# Patient Record
Sex: Male | Born: 1975 | Marital: Married | Smoking: Former smoker
Health system: Southern US, Community
[De-identification: ages and names within clinical notes are randomized; demographics above are authoritative.]

## PROBLEM LIST (undated history)

## (undated) DIAGNOSIS — K219 Gastro-esophageal reflux disease without esophagitis: Secondary | ICD-10-CM

## (undated) DIAGNOSIS — E119 Type 2 diabetes mellitus without complications: Secondary | ICD-10-CM

## (undated) HISTORY — PX: NO PAST SURGERIES: SHX2092

---

## 2014-08-16 ENCOUNTER — Ambulatory Visit
Admission: RE | Admit: 2014-08-16 | Discharge: 2014-08-16 | Disposition: A | Discharge status: Home / Self Care | Payer: Self-pay | Source: Ambulatory Visit | Attending: Occupational Medicine | Admitting: Occupational Medicine

## 2014-08-16 ENCOUNTER — Other Ambulatory Visit: Payer: Self-pay | Admitting: Occupational Medicine

## 2014-08-16 DIAGNOSIS — Z111 Encounter for screening for respiratory tuberculosis: Secondary | ICD-10-CM

## 2015-07-18 ENCOUNTER — Ambulatory Visit: Payer: Self-pay

## 2015-07-29 ENCOUNTER — Encounter: Payer: Self-pay | Admitting: Urology

## 2015-07-29 ENCOUNTER — Ambulatory Visit (INDEPENDENT_AMBULATORY_CARE_PROVIDER_SITE_OTHER): Payer: 59 | Admitting: Urology

## 2015-07-29 VITALS — BP 102/69 | HR 83 | Ht 71.0 in | Wt 220.5 lb

## 2015-07-29 DIAGNOSIS — I10 Essential (primary) hypertension: Secondary | ICD-10-CM | POA: Insufficient documentation

## 2015-07-29 DIAGNOSIS — N471 Phimosis: Secondary | ICD-10-CM | POA: Diagnosis not present

## 2015-07-29 DIAGNOSIS — G4700 Insomnia, unspecified: Secondary | ICD-10-CM | POA: Insufficient documentation

## 2015-07-29 DIAGNOSIS — E119 Type 2 diabetes mellitus without complications: Secondary | ICD-10-CM | POA: Insufficient documentation

## 2015-07-29 NOTE — Addendum Note (Signed)
Addended by: Lonna CobbUSSELL, Jowana Thumma L on: 07/29/2015 03:29 PM   Modules accepted: Orders

## 2015-07-29 NOTE — Progress Notes (Signed)
07/29/2015 12:02 PM   Vincent Carlson [birth year] [id number]  Referring provider: Jerl Mina, MD [address] [facility] [place], [place] [address]  Chief Complaint  Patient presents with  . New Patient (Initial Visit)    Phimosis    HPI: The patient is a 40 year old gentleman with a past medical history of diabetes presents today for evaluation of phimosis. The patient has no cyst for approximately one year. It has not worsened to the point that he is no longer able to retract his foreskin. He is unable to have intercourse is resolved this. He is also unable to clean his glans. He's had some issues with paraphimosis for a struggle to reduce his foreskin. He is interested in undergoing a circumcision.   PMH: No past medical history on file.  Surgical History: No past surgical history on file.  Home Medications:    Medication List       This list is accurate as of: 07/29/15 12:02 PM.  Always use your most recent med list.               FARXIGA 10 MG Tabs tablet  Generic drug:  dapagliflozin propanediol  Take by mouth.     glimepiride 4 MG tablet  Commonly known as:  AMARYL  Take 4 mg by mouth daily with breakfast.     lisinopril 10 MG tablet  Commonly known as:  PRINIVIL,ZESTRIL     metFORMIN 1000 MG tablet  Commonly known as:  GLUCOPHAGE  Take 1,000 mg by mouth 2 (two) times daily with a meal.     VICTOZA 18 MG/3ML Sopn  Generic drug:  Liraglutide        Allergies: No Known Allergies  Family History: Family History  Problem Relation Age of Onset  . Prostate cancer Neg Hx   . Kidney cancer Neg Hx     Social History:  reports that he quit smoking about 2 years ago. He does not have any smokeless tobacco history on file. He reports that he drinks about 1.2 oz of alcohol per week. He reports that he does not use illicit drugs.  ROS: UROLOGY Frequent Urination?: No Hard to postpone urination?: No Burning/pain with urination?: Yes Get up at  night to urinate?: Yes Leakage of urine?: No Urine stream starts and stops?: No Trouble starting stream?: No Do you have to strain to urinate?: No Blood in urine?: No Urinary tract infection?: No Sexually transmitted disease?: No Injury to kidneys or bladder?: No Painful intercourse?: Yes Weak stream?: Yes Erection problems?: No Penile pain?: Yes  Gastrointestinal Nausea?: No Vomiting?: No Indigestion/heartburn?: No Diarrhea?: No Constipation?: No  Constitutional Fever: No Night sweats?: No Weight loss?: No Fatigue?: No  Skin Skin rash/lesions?: No Itching?: No  Eyes Blurred vision?: No Double vision?: No  Ears/Nose/Throat Sore throat?: No Sinus problems?: No  Hematologic/Lymphatic Swollen glands?: No Easy bruising?: No  Cardiovascular Leg swelling?: No Chest pain?: No  Respiratory Cough?: No Shortness of breath?: No  Endocrine Excessive thirst?: No  Musculoskeletal Back pain?: No Joint pain?: No  Neurological Headaches?: No Dizziness?: No  Psychologic Depression?: No Anxiety?: No  Physical Exam: BP 102/69 mmHg  Pulse 83  Ht  (1.803 m)  Wt 220 lb 8 oz (100.018 kg)  BMI 30.77 kg/m2  Constitutional:  Alert and oriented, No acute distress. HEENT: [place] AT, moist mucus membranes.  Trachea midline, no masses. Cardiovascular: No clubbing, cyanosis, or edema. Respiratory: Normal respiratory effort, no increased work of breathing. GI:  Abdomen is soft, nontender, nondistended, no abdominal masses GU: No CVA tenderness. Patient with phimosis. Unable to retract foreskin exam. Normal testicles descended equally bilaterally. No masses. Skin: No rashes, bruises or suspicious lesions. Lymph: No cervical or inguinal adenopathy. Neurologic: Grossly intact, no focal deficits, moving all 4 extremities. Psychiatric: Normal mood and affect.  Laboratory Data: No results found for: WBC, HGB, HCT, MCV, PLT  No results found for: CREATININE  No  results found for: PSA  No results found for: TESTOSTERONE  No results found for: HGBA1C  Urinalysis No results found for: COLORURINE, APPEARANCEUR, LABSPEC, PHURINE, GLUCOSEU, HGBUR, BILIRUBINUR, KETONESUR, PROTEINUR, UROBILINOGEN, NITRITE, LEUKOCYTESUR  Pertinent   Assessment & Plan:  1. Phimosis Discussed the patient the risks, benefits, and indications for a circumcision. He understands the risks include but are not limited to bleeding, infection, poor cosmesis, and damage to surrounding structures. All questions were answered. The patient elected to proceed with a circumcision.Hildred Laser.   Jodey Burbano James Sura Canul, MD  [facility] [address], [address] [place], [place] [address] [phone]

## 2015-08-01 LAB — CULTURE, URINE COMPREHENSIVE

## 2015-08-02 ENCOUNTER — Telehealth: Payer: Self-pay | Admitting: Radiology

## 2015-08-02 NOTE — Telephone Encounter (Signed)
LMOM. Need to notify pt of surgery information. 

## 2015-08-02 NOTE — Telephone Encounter (Signed)
Notified pt of surgery scheduled with Dr Sherryl Barters on 08/31/15, pre-admit phone interview on 08/19/15 between 9am-1pm & to call day prior to surgery for arrival time to SDS. Pt voices understanding.

## 2015-08-19 ENCOUNTER — Encounter
Admission: RE | Admit: 2015-08-19 | Discharge: 2015-08-19 | Disposition: A | Discharge status: Home / Self Care | Payer: 59 | Source: Ambulatory Visit | Attending: Urology | Admitting: Urology

## 2015-08-19 HISTORY — DX: Type 2 diabetes mellitus without complications: E11.9

## 2015-08-19 HISTORY — DX: Gastro-esophageal reflux disease without esophagitis: K21.9

## 2015-08-19 NOTE — Patient Instructions (Signed)
  Your procedure is scheduled on: 08-31-15 [facility](WEDNESDAY) Report to Same Day Surgery 2nd floor medical mall To find out your arrival time please call [phone] between 1PM - 3PM on 08-30-15 (TUESDAY)  Remember: Instructions that are not followed completely may result in serious medical risk, up to and including death, or upon the discretion of your surgeon and anesthesiologist your surgery may need to be rescheduled.    _x___ 1. Do not eat food or drink liquids after midnight. No gum chewing or hard candies.     __x__ 2. No Alcohol for 24 hours before or after surgery.   __x__3. No Smoking for 24 prior to surgery.   ____  4. Bring all medications with you on the day of surgery if instructed.    __x__ 5. Notify your doctor if there is any change in your medical condition     (cold, fever, infections).     Do not wear jewelry, make-up, hairpins, clips or nail polish.  Do not wear lotions, powders, or perfumes. You may wear deodorant.  Do not shave 48 hours prior to surgery. Men may shave face and neck.  Do not bring valuables to the hospital.    [facility] is not responsible for any belongings or valuables.               Contacts, dentures or bridgework may not be worn into surgery.  Leave your suitcase in the car. After surgery it may be brought to your room.  For patients admitted to the hospital, discharge time is determined by your treatment team.   Patients discharged the day of surgery will not be allowed to drive home.    Please read over the following fact sheets that you were given:   [facility] Preparing for Surgery and or MRSA Information   _x___ Take these medicines the morning of surgery with A SIP OF WATER:    1. LISINOPRIL  2.  3.  4.  5.  6.  ____ Fleet Enema (as directed)   _x___ Use CHG Soap or sage wipes as directed on instruction sheet   ____ Use inhalers on the day of surgery and bring to hospital day of surgery  _X___ Stop metformin 2 days prior to  surgery-LAST DOSE ON Sunday 08-28-15     ____ Take 1/2 of usual insulin dose the night before surgery and none on the morning of surgery.   ____ Stop aspirin or coumadin, or plavix  _x__ Stop Anti-inflammatories such as Advil, Aleve, Ibuprofen, Motrin, Naproxen,          Naprosyn, Goodies powders or aspirin products. Ok to take Tylenol.   ____ Stop supplements until after surgery.    ____ Bring C-Pap to the hospital.

## 2015-08-22 MED ORDER — FAMOTIDINE 20 MG PO TABS
ORAL_TABLET | ORAL | Status: AC
  Filled 2015-08-22: qty 1

## 2015-08-22 MED ORDER — CIPROFLOXACIN IN D5W 400 MG/200ML IV SOLN
INTRAVENOUS | Status: AC
  Filled 2015-08-22: qty 200

## 2015-08-29 ENCOUNTER — Telehealth: Payer: Self-pay | Admitting: Radiology

## 2015-08-29 NOTE — Telephone Encounter (Signed)
Notified pt that surgery has been r/s to 10/26/15 as requested. Ad[facility] he will not need to repeat pre-admission appt prior to surgery. Advised pt to follow instructions given by pre-admit testing including being npo after mn & to call day prior to surgery for arrival time to SDS. Pt voices understanding.

## 2015-08-29 NOTE — Telephone Encounter (Signed)
Pt called to r/s surgery with Dr Sherryl BartersBudzyn from 08/31/15 to 10/26/15. Will arrange this.

## 2015-08-30 NOTE — Pre-Procedure Instructions (Deleted)
SPOKE WITH ASHLEY AT DR MASOUDS ABOUT PTS ABNORMAL EKG AND TO MAKE SURE SHE HAD GOTTEN MY MESSAGE YESTERDAY ALONG WITH THE EKG AND CLEARANCE REQUEST THAT I HAD FAXED OVER.  ASHLEY STATED THAT SHE DID GET MY MESSAGE AND CLEARANCE PAPERWORK THAT I HAD FAXED OVER AND THAT THEY ARE GOING TO HAVE TO GET PT TO COME IN TODAY FOR DR MASOUD TO SEE HER.  I GAVE ASHLEY MY DIRECT # AGAIN AND TOLD HER TO CALL AND LET ME KNOW IF PT WILL BE CLEARED FOR HER SURGERY TOMORROW.  SHE VERBALIZED THAT SHE WOULD

## 2015-10-23 NOTE — Patient Instructions (Signed)
  Your procedure is scheduled on: 10-26-15 [facility](WEDNESDAY) Report to Same Day Surgery 2nd floor medical mall To find out your arrival time please call [phone] between 1PM - 3PM on 10-25-15 (TUESDAY)  Remember: Instructions that are not followed completely may result in serious medical risk, up to and including death, or upon the discretion of your surgeon and anesthesiologist your surgery may need to be rescheduled.    _x___ 1. Do not eat food or drink liquids after midnight. No gum chewing or hard candies.     __x__ 2. No Alcohol for 24 hours before or after surgery.   __x__3. No Smoking for 24 prior to surgery.   ____  4. Bring all medications with you on the day of surgery if instructed.    __x__ 5. Notify your doctor if there is any change in your medical condition     (cold, fever, infections).     Do not wear jewelry, make-up, hairpins, clips or nail polish.  Do not wear lotions, powders, or perfumes. You may wear deodorant.  Do not shave 48 hours prior to surgery. Men may shave face and neck.  Do not bring valuables to the hospital.    [facility] is not responsible for any belongings or valuables.               Contacts, dentures or bridgework may not be worn into surgery.  Leave your suitcase in the car. After surgery it may be brought to your room.  For patients admitted to the hospital, discharge time is determined by your treatment team.   Patients discharged the day of surgery will not be allowed to drive home.    Please read over the following fact sheets that you were given:   [facility] Preparing for Surgery and or MRSA Information   _x___ Take these medicines the morning of surgery with A SIP OF WATER:    1. LISINOPRIL  2.  3.  4.  5.  6.  ____Fleets enema or Magnesium Citrate as directed.   _x___ Use CHG Soap or sage wipes as directed on instruction sheet   ____ Use inhalers on the day of surgery and bring to hospital day of surgery  _X___ Stop  metformin 2 days prior to surgery-LAST DOSE Sunday, October 15th    ____ Take 1/2 of usual insulin dose the night before surgery and none on the morning of   surgery.   ____ Stop aspirin or coumadin, or plavix  x__ Stop Anti-inflammatories such as Advil, Aleve, Ibuprofen, Motrin, Naproxen,          Naprosyn, Goodies powders or aspirin products. Ok to take Tylenol.   ____ Stop supplements until after surgery.    ____ Bring C-Pap to the hospital.

## 2015-10-24 ENCOUNTER — Encounter
Admission: RE | Admit: 2015-10-24 | Discharge: 2015-10-24 | Disposition: A | Discharge status: Home / Self Care | Payer: 59 | Source: Ambulatory Visit | Attending: Urology | Admitting: Urology

## 2015-10-24 DIAGNOSIS — K219 Gastro-esophageal reflux disease without esophagitis: Secondary | ICD-10-CM | POA: Diagnosis not present

## 2015-10-24 DIAGNOSIS — Z01812 Encounter for preprocedural laboratory examination: Secondary | ICD-10-CM | POA: Insufficient documentation

## 2015-10-24 DIAGNOSIS — I498 Other specified cardiac arrhythmias: Secondary | ICD-10-CM

## 2015-10-24 DIAGNOSIS — E119 Type 2 diabetes mellitus without complications: Secondary | ICD-10-CM | POA: Diagnosis not present

## 2015-10-24 DIAGNOSIS — Z87891 Personal history of nicotine dependence: Secondary | ICD-10-CM | POA: Diagnosis not present

## 2015-10-24 DIAGNOSIS — N471 Phimosis: Secondary | ICD-10-CM | POA: Insufficient documentation

## 2015-10-24 DIAGNOSIS — Z79899 Other long term (current) drug therapy: Secondary | ICD-10-CM | POA: Diagnosis not present

## 2015-10-24 DIAGNOSIS — I10 Essential (primary) hypertension: Secondary | ICD-10-CM | POA: Diagnosis not present

## 2015-10-24 DIAGNOSIS — Z7984 Long term (current) use of oral hypoglycemic drugs: Secondary | ICD-10-CM | POA: Diagnosis not present

## 2015-10-24 LAB — BASIC METABOLIC PANEL
Anion gap: 10 (ref 5–15)
BUN: 13 mg/dL (ref 6–20)
CHLORIDE: 106 mmol/L (ref 101–111)
CO2: 23 mmol/L (ref 22–32)
CREATININE: 0.8 mg/dL (ref 0.61–1.24)
Calcium: 9.2 mg/dL (ref 8.9–10.3)
Glucose, Bld: 115 mg/dL — ABNORMAL HIGH (ref 65–99)
Potassium: 3.9 mmol/L (ref 3.5–5.1)
SODIUM: 139 mmol/L (ref 135–145)

## 2015-10-26 ENCOUNTER — Encounter
Admission: RE | Disposition: A | Discharge status: Home / Self Care | Payer: Self-pay | Source: Ambulatory Visit | Attending: Urology

## 2015-10-26 ENCOUNTER — Telehealth: Payer: Self-pay

## 2015-10-26 ENCOUNTER — Ambulatory Visit: Payer: 59 | Admitting: Registered Nurse

## 2015-10-26 ENCOUNTER — Telehealth: Payer: Self-pay | Admitting: Urology

## 2015-10-26 ENCOUNTER — Encounter: Payer: Self-pay | Admitting: *Deleted

## 2015-10-26 ENCOUNTER — Ambulatory Visit
Admission: RE | Admit: 2015-10-26 | Discharge: 2015-10-26 | Disposition: A | Discharge status: Home / Self Care | Payer: 59 | Source: Ambulatory Visit | Attending: Urology | Admitting: Urology

## 2015-10-26 DIAGNOSIS — Z79899 Other long term (current) drug therapy: Secondary | ICD-10-CM | POA: Insufficient documentation

## 2015-10-26 DIAGNOSIS — E119 Type 2 diabetes mellitus without complications: Secondary | ICD-10-CM | POA: Diagnosis not present

## 2015-10-26 DIAGNOSIS — N471 Phimosis: Secondary | ICD-10-CM | POA: Diagnosis not present

## 2015-10-26 DIAGNOSIS — K219 Gastro-esophageal reflux disease without esophagitis: Secondary | ICD-10-CM | POA: Insufficient documentation

## 2015-10-26 DIAGNOSIS — Z87891 Personal history of nicotine dependence: Secondary | ICD-10-CM | POA: Diagnosis not present

## 2015-10-26 DIAGNOSIS — I10 Essential (primary) hypertension: Secondary | ICD-10-CM | POA: Insufficient documentation

## 2015-10-26 DIAGNOSIS — Z7984 Long term (current) use of oral hypoglycemic drugs: Secondary | ICD-10-CM | POA: Insufficient documentation

## 2015-10-26 HISTORY — PX: CIRCUMCISION: SHX1350

## 2015-10-26 LAB — GLUCOSE, CAPILLARY: Glucose-Capillary: 129 mg/dL — ABNORMAL HIGH (ref 65–99)

## 2015-10-26 SURGERY — CIRCUMCISION, ADULT
Anesthesia: General

## 2015-10-26 MED ORDER — FAMOTIDINE 20 MG PO TABS
20.0000 mg | ORAL_TABLET | Freq: Once | ORAL | Status: AC
  Administered 2015-10-26: 20 mg via ORAL

## 2015-10-26 MED ORDER — ONDANSETRON HCL 4 MG/2ML IJ SOLN: 4.0000 mg | Freq: Once | INTRAMUSCULAR | Status: DC | PRN

## 2015-10-26 MED ORDER — FENTANYL CITRATE (PF) 100 MCG/2ML IJ SOLN
INTRAMUSCULAR | Status: DC | PRN
  Administered 2015-10-26: 25 ug via INTRAVENOUS
  Administered 2015-10-26: 50 ug via INTRAVENOUS
  Administered 2015-10-26 (×2): 25 ug via INTRAVENOUS

## 2015-10-26 MED ORDER — SODIUM CHLORIDE 0.9 % IV SOLN
INTRAVENOUS | Status: DC
  Administered 2015-10-26: 07:00:00 via INTRAVENOUS

## 2015-10-26 MED ORDER — FAMOTIDINE 20 MG PO TABS
ORAL_TABLET | ORAL | Status: AC
  Administered 2015-10-26: 20 mg via ORAL
  Filled 2015-10-26: qty 1

## 2015-10-26 MED ORDER — CEPHALEXIN 500 MG PO CAPS: 500.0000 mg | ORAL_CAPSULE | Freq: Three times a day (TID) | ORAL | 0 refills | Status: DC

## 2015-10-26 MED ORDER — BACITRACIN ZINC 500 UNIT/GM EX OINT
TOPICAL_OINTMENT | CUTANEOUS | Status: AC
  Filled 2015-10-26: qty 28.35

## 2015-10-26 MED ORDER — LIDOCAINE HCL (CARDIAC) 20 MG/ML IV SOLN
INTRAVENOUS | Status: DC | PRN
  Administered 2015-10-26: 80 mg via INTRAVENOUS

## 2015-10-26 MED ORDER — BUPIVACAINE HCL (PF) 0.5 % IJ SOLN
INTRAMUSCULAR | Status: DC | PRN
  Administered 2015-10-26: 10 mL

## 2015-10-26 MED ORDER — BUPIVACAINE HCL (PF) 0.5 % IJ SOLN
INTRAMUSCULAR | Status: AC
Start: 2015-10-26 — End: 2015-10-26
  Filled 2015-10-26: qty 30

## 2015-10-26 MED ORDER — FENTANYL CITRATE (PF) 100 MCG/2ML IJ SOLN
INTRAMUSCULAR | Status: AC
  Administered 2015-10-26: 25 ug via INTRAVENOUS
  Filled 2015-10-26: qty 2

## 2015-10-26 MED ORDER — HYDROCODONE-ACETAMINOPHEN 5-325 MG PO TABS: 1.0000 | ORAL_TABLET | ORAL | 0 refills | Status: DC | PRN

## 2015-10-26 MED ORDER — CEFAZOLIN SODIUM-DEXTROSE 2-4 GM/100ML-% IV SOLN
INTRAVENOUS | Status: AC
  Administered 2015-10-26: 2 g via INTRAVENOUS
  Filled 2015-10-26: qty 100

## 2015-10-26 MED ORDER — ONDANSETRON HCL 4 MG/2ML IJ SOLN
INTRAMUSCULAR | Status: DC | PRN
  Administered 2015-10-26: 4 mg via INTRAVENOUS

## 2015-10-26 MED ORDER — FENTANYL CITRATE (PF) 100 MCG/2ML IJ SOLN
25.0000 ug | INTRAMUSCULAR | Status: DC | PRN
  Administered 2015-10-26 (×4): 25 ug via INTRAVENOUS

## 2015-10-26 MED ORDER — ACETAMINOPHEN 10 MG/ML IV SOLN
INTRAVENOUS | Status: AC
  Filled 2015-10-26: qty 100

## 2015-10-26 MED ORDER — PROPOFOL 10 MG/ML IV BOLUS
INTRAVENOUS | Status: DC | PRN
  Administered 2015-10-26: 200 mg via INTRAVENOUS

## 2015-10-26 MED ORDER — CEFAZOLIN SODIUM-DEXTROSE 2-4 GM/100ML-% IV SOLN
2.0000 g | Freq: Once | INTRAVENOUS | Status: AC
  Administered 2015-10-26: 2 g via INTRAVENOUS

## 2015-10-26 MED ORDER — MIDAZOLAM HCL 2 MG/2ML IJ SOLN
INTRAMUSCULAR | Status: DC | PRN
  Administered 2015-10-26: 2 mg via INTRAVENOUS

## 2015-10-26 MED ORDER — ACETAMINOPHEN 10 MG/ML IV SOLN
INTRAVENOUS | Status: DC | PRN
  Administered 2015-10-26: 1000 mg via INTRAVENOUS

## 2015-10-26 SURGICAL SUPPLY — 24 items
BANDAGE CONFORM 2X5YD N/S (GAUZE/BANDAGES/DRESSINGS) ×3 IMPLANT
BLADE SURG 15 STRL LF DISP TIS (BLADE) ×1 IMPLANT
BLADE SURG 15 STRL SS (BLADE) ×2
CANISTER SUCT 1200ML W/VALVE (MISCELLANEOUS) ×3 IMPLANT
CHLORAPREP W/TINT 26ML (MISCELLANEOUS) ×3 IMPLANT
DRAPE LAPAROTOMY 77X122 PED (DRAPES) ×3 IMPLANT
ELECT REM PT RETURN 9FT ADLT (ELECTROSURGICAL) ×3
ELECTRODE REM PT RTRN 9FT ADLT (ELECTROSURGICAL) ×1 IMPLANT
GAUZE PETROLATUM 1 X8 (GAUZE/BANDAGES/DRESSINGS) ×3 IMPLANT
GAUZE STRETCH 2X75IN STRL (MISCELLANEOUS) ×3 IMPLANT
GLOVE BIO SURGEON STRL SZ7 (GLOVE) ×6 IMPLANT
GLOVE BIOGEL M STRL SZ7.5 (GLOVE) ×3 IMPLANT
GLOVE INDICATOR 7.5 STRL GRN (GLOVE) ×6 IMPLANT
GOWN STRL REUS W/ TWL LRG LVL3 (GOWN DISPOSABLE) ×1 IMPLANT
GOWN STRL REUS W/ TWL XL LVL3 (GOWN DISPOSABLE) ×1 IMPLANT
GOWN STRL REUS W/TWL LRG LVL3 (GOWN DISPOSABLE) ×2
GOWN STRL REUS W/TWL XL LVL3 (GOWN DISPOSABLE) ×2
KIT RM TURNOVER STRD PROC AR (KITS) ×3 IMPLANT
LABEL OR SOLS (LABEL) ×3 IMPLANT
NEEDLE HYPO 25X1 1.5 SAFETY (NEEDLE) ×3 IMPLANT
NS IRRIG 500ML POUR BTL (IV SOLUTION) ×3 IMPLANT
PACK BASIN MINOR ARMC (MISCELLANEOUS) ×3 IMPLANT
SUT CHROMIC 3 0 SH 27 (SUTURE) ×12 IMPLANT
SYRINGE 10CC LL (SYRINGE) ×3 IMPLANT

## 2015-10-26 NOTE — Telephone Encounter (Signed)
-----   Message from Hildred LaserBrian James Budzyn, MD sent at 10/26/2015  8:58 AM EDT ----- Patient needs post op check in one month. thanks

## 2015-10-26 NOTE — Telephone Encounter (Signed)
done

## 2015-10-26 NOTE — H&P (Signed)
Expand All Collapse All       Carmelia RollerKaushal Nardo 05/22/1975 [id number]  Referring provider: Jerl MinaJames Hedrick, MD [address] [facility] [place], [place] [address]      Chief Complaint  Patient presents with  . New Patient (Initial Visit)    Phimosis    HPI: The patient is a 40 year old gentleman with a past medical history of diabetes presents today for evaluation of phimosis. The patient has no cyst for approximately one year. It has not worsened to the point that he is no longer able to retract his foreskin. He is unable to have intercourse is resolved this. He is also unable to clean his glans. He's had some issues with paraphimosis for a struggle to reduce his foreskin. He is interested in undergoing a circumcision.   PMH: No past medical history on file.  Surgical History: No past surgical history on file.  Home Medications:        Medication List       This list is accurate as of: 07/29/15 12:02 PM.  Always use your most recent med list.                FARXIGA 10 MG Tabs tablet  Generic drug:  dapagliflozin propanediol  Take by mouth.     glimepiride 4 MG tablet  Commonly known as:  AMARYL  Take 4 mg by mouth daily with breakfast.     lisinopril 10 MG tablet  Commonly known as:  PRINIVIL,ZESTRIL     metFORMIN 1000 MG tablet  Commonly known as:  GLUCOPHAGE  Take 1,000 mg by mouth 2 (two) times daily with a meal.     VICTOZA 18 MG/3ML Sopn  Generic drug:  Liraglutide        Allergies: No Known Allergies  Family History:      Family History  Problem Relation Age of Onset  . Prostate cancer Neg Hx   . Kidney cancer Neg Hx     Social History:  reports that he quit smoking about 2 years ago. He does not have any smokeless tobacco history on file. He reports that he drinks about 1.2 oz of alcohol per week. He reports that he does not use illicit drugs.  ROS: UROLOGY Frequent Urination?: No Hard to  postpone urination?: No Burning/pain with urination?: Yes Get up at night to urinate?: Yes Leakage of urine?: No Urine stream starts and stops?: No Trouble starting stream?: No Do you have to strain to urinate?: No Blood in urine?: No Urinary tract infection?: No Sexually transmitted disease?: No Injury to kidneys or bladder?: No Painful intercourse?: Yes Weak stream?: Yes Erection problems?: No Penile pain?: Yes  Gastrointestinal Nausea?: No Vomiting?: No Indigestion/heartburn?: No Diarrhea?: No Constipation?: No  Constitutional Fever: No Night sweats?: No Weight loss?: No Fatigue?: No  Skin Skin rash/lesions?: No Itching?: No  Eyes Blurred vision?: No Double vision?: No  Ears/Nose/Throat Sore throat?: No Sinus problems?: No  Hematologic/Lymphatic Swollen glands?: No Easy bruising?: No  Cardiovascular Leg swelling?: No Chest pain?: No  Respiratory Cough?: No Shortness of breath?: No  Endocrine Excessive thirst?: No  Musculoskeletal Back pain?: No Joint pain?: No  Neurological Headaches?: No Dizziness?: No  Psychologic Depression?: No Anxiety?: No  Physical Exam: BP 102/69 mmHg  Pulse 83  Ht 5\' 11"  (1.803 m)  Wt 220 lb 8 oz (100.018 kg)  BMI 30.77 kg/m2  Constitutional:  Alert and oriented, No acute distress. HEENT: [place] AT, moist mucus membranes.  Trachea midline,  no masses. Cardiovascular: No clubbing, cyanosis, or edema. RRR Respiratory: Normal respiratory effort, no increased work of breathing. Lungs clear GI: Abdomen is soft, nontender, nondistended, no abdominal masses GU: No CVA tenderness. Patient with phimosis. Unable to retract foreskin exam. Normal testicles descended equally bilaterally. No masses. Skin: No rashes, bruises or suspicious lesions. Lymph: No cervical or inguinal adenopathy. Neurologic: Grossly intact, no focal deficits, moving all 4 extremities. Psychiatric: Normal mood and affect.  Laboratory  Data: RecentLabs  No results found for: WBC, HGB, HCT, MCV, PLT    RecentLabs  No results found for: CREATININE    RecentLabs  No results found for: PSA    RecentLabs  No results found for: TESTOSTERONE    RecentLabs  No results found for: HGBA1C    Urinalysis Labs(Brief)  No results found for: COLORURINE, APPEARANCEUR, LABSPEC, PHURINE, GLUCOSEU, HGBUR, BILIRUBINUR, KETONESUR, PROTEINUR, UROBILINOGEN, NITRITE, LEUKOCYTESUR    Pertinent   Assessment & Plan:  1. Phimosis Discussed the patient the risks, benefits, and indications for a circumcision. He understands the risks include but are not limited to bleeding, infection, poor cosmesis, and damage to surrounding structures. All questions were answered. The patient elected to proceed with a [place]  [id number] [facility] [address] [place], [place] [address] [phone]

## 2015-10-26 NOTE — Anesthesia Procedure Notes (Signed)
Procedure Name: LMA Insertion Date/Time: 10/26/2015 7:55 AM Performed by: Karoline CaldwellSTARR, Marizol Borror Pre-anesthesia Checklist: Patient identified, Emergency Drugs available, Suction available, Patient being monitored and Timeout performed Patient Re-evaluated:Patient Re-evaluated prior to inductionOxygen Delivery Method: Circle system utilized Preoxygenation: Pre-oxygenation with 100% oxygen Intubation Type: IV induction Ventilation: Mask ventilation without difficulty LMA: LMA inserted LMA Size: 4.5 Number of attempts: 1 Tube secured with: Tape Dental Injury: Teeth and Oropharynx as per pre-operative assessment

## 2015-10-26 NOTE — Op Note (Signed)
Date of procedure: 10/26/15  Preoperative diagnosis:  1. Phimosis   Postoperative diagnosis:  1. Phimosis   Procedure: 1. Circumcision  Surgeon: Baruch Gouty, MD  Anesthesia: General  Complications: None  Intraoperative findings: The patient had a phimosis that was difficult to retract. He underwent an unremarkable circumcision.  EBL: None  Specimens: None  Drains: None  Disposition: Stable to the postanesthesia care unit  Indication for procedure: The patient is a 40 y.o. male with a phimosis was unable to retract his foreskin for over a year presents today for elective circumcision.  After reviewing the management options for treatment, the patient elected to proceed with the above surgical procedure(s). We have discussed the potential benefits and risks of the procedure, side effects of the proposed treatment, the likelihood of the patient achieving the goals of the procedure, and any potential problems that might occur during the procedure or recuperation. Informed consent has been obtained.  Description of procedure: The patient was met in the preoperative area. All risks, benefits, and indications of the procedure were described in great detail. The patient consented to the procedure. Preoperative antibiotics were given. The patient was taken to the operative theater. General anesthesia was induced per the anesthesia service. The patient was then placed in the supine position and prepped and draped in the usual sterile fashion. A preoperative timeout was called.   Markings were made in a circumferential fashion and the proximal and distal ends of the foreskin. This was measured to ensure that he appropriate amount of penile skin remaining. These the distal and proximal markings were then incised with a #15 blade scalpel. The foreskin was then removed with electrocautery with great care not to injure surrounding structures. After the foreskin was removed, hemostasis was obtained  which was excellent. Penile skin was reapproximated with 3-0 chromic at 12:00 and 6:00. 3-0 interrupted chromic sutures then placed at 6:00 and 9:00. The remaining areas of skin reapproximated circumferentially with 3-0 chromic interrupted sutures. The right limb was then placed incision. It was wrapped in gauze. The patient was awoke from anesthesia and transferred in stable condition to the post anesthesia care unit.  Plan: The patient will follow-up in one month for a wound check.  Baruch Gouty, M.D.

## 2015-10-26 NOTE — Addendum Note (Signed)
Addendum  created 10/26/15 1332 by Karoline Caldwelleana Marvell Tamer, CRNA   Charge Capture section accepted

## 2015-10-26 NOTE — Anesthesia Preprocedure Evaluation (Signed)
Anesthesia Evaluation  Patient identified by MRN, date of birth, ID band Patient awake    Reviewed: Allergy & Precautions, NPO status , Patient's Chart, lab work & pertinent test results  Airway Mallampati: III  TM Distance: >3 FB     Dental no notable dental hx. (+) Chipped   Pulmonary former smoker,    Pulmonary exam normal        Cardiovascular hypertension, Pt. on medications Normal cardiovascular exam     Neuro/Psych negative neurological ROS  negative psych ROS   GI/Hepatic Neg liver ROS, GERD  Controlled,  Endo/Other  diabetes, Well Controlled, Type 2, Oral Hypoglycemic Agents  Renal/GU negative Renal ROS  negative genitourinary   Musculoskeletal negative musculoskeletal ROS (+)   Abdominal Normal abdominal exam  (+)   Peds negative pediatric ROS (+)  Hematology negative hematology ROS (+)   Anesthesia Other Findings   Reproductive/Obstetrics                             Anesthesia Physical Anesthesia Plan  ASA: II  Anesthesia Plan: General   Post-op Pain Management:    Induction: Intravenous  Airway Management Planned: LMA  Additional Equipment:   Intra-op Plan:   Post-operative Plan: Extubation in OR  Informed Consent: I have reviewed the patients History and Physical, chart, labs and discussed the procedure including the risks, benefits and alternatives for the proposed anesthesia with the patient or authorized representative who has indicated his/her understanding and acceptance.   Dental advisory given  Plan Discussed with: CRNA and Surgeon  Anesthesia Plan Comments:         Anesthesia Quick Evaluation

## 2015-10-26 NOTE — Transfer of Care (Signed)
Immediate Anesthesia Transfer of Care Note  Patient: Vincent Carlson  Procedure(s) Performed: Procedure(s): CIRCUMCISION ADULT (N/A)  Patient Location: PACU  Anesthesia Type:General  Level of Consciousness: awake  Airway & Oxygen Therapy: Patient Spontanous Breathing  Post-op Assessment: Report given to RN  Post vital signs: stable  Last Vitals:  Vitals:   10/26/15 0857 10/26/15 0902  BP:    Pulse:  65  Resp:  10  Temp: (P) 36.7 C 36.1 C    Last Pain:  Vitals:   10/26/15 0902  TempSrc: Temporal         Complications: No apparent anesthesia complications

## 2015-10-26 NOTE — Discharge Instructions (Signed)

## 2015-10-26 NOTE — Anesthesia Postprocedure Evaluation (Signed)
Anesthesia Post Note  Patient: Vincent Carlson  Procedure(s) Performed: Procedure(s) (LRB): CIRCUMCISION ADULT (N/A)  Patient location during evaluation: PACU Anesthesia Type: General Level of consciousness: awake and alert and oriented Pain management: pain level controlled Vital Signs Assessment: post-procedure vital signs reviewed and stable Respiratory status: spontaneous breathing Cardiovascular status: blood pressure returned to baseline Anesthetic complications: no    Last Vitals:  Vitals:   10/26/15 1014 10/26/15 1044  BP: (!) 119/91 133/79  Pulse: 68 62  Resp:  18  Temp: 36.1 C     Last Pain:  Vitals:   10/26/15 1044  TempSrc:   PainSc: 2                  Lemario Chaikin

## 2015-10-31 ENCOUNTER — Telehealth: Payer: Self-pay

## 2015-10-31 NOTE — Telephone Encounter (Signed)
The pt called complaining that he still got some swelling, discomfort to his incision site after circumcision x 5 days ago. I informed the pt that swelling and soreness is normal. I reassured him that swelling, slight redness and scant drainage along the incision site is normal. I told him if he notice increased pain and swelling or generalized redness to notify us. Pt verbal understanding.

## 2015-11-25 ENCOUNTER — Encounter: Payer: Self-pay | Admitting: Urology

## 2015-11-25 ENCOUNTER — Ambulatory Visit: Payer: 59 | Admitting: Urology

## 2015-11-25 VITALS — BP 114/77 | HR 88 | Ht 71.0 in | Wt 224.2 lb

## 2015-11-25 DIAGNOSIS — N471 Phimosis: Secondary | ICD-10-CM

## 2015-11-25 NOTE — Progress Notes (Signed)
11/25/2015 9:39 AM   Carmelia RollerKaushal Addison 02/07/1975 [id number]  Referring provider: Jerl MinaJames Hedrick, MD [address] [facility] [place], [place] [address]  Chief Complaint  Patient presents with  . Routine Post Op    Wound check    HPI: The patient is a 40 year old gentleman who presents for a wound check following a circumcision. Circumcision was in 10/26/2015.  It is healing well. His pain is improved. He does note that= his frenulum this seems still attached to him. He is currently not bothered by this.   PMH: Past Medical History:  Diagnosis Date  . Diabetes mellitus without complication (HCC)   . GERD (gastroesophageal reflux disease)    OCC    Surgical History: Past Surgical History:  Procedure Laterality Date  . CIRCUMCISION N/A 10/26/2015   Procedure: CIRCUMCISION ADULT;  Surgeon: Hildred LaserBrian James Hartman Minahan, MD;  Location: [facility] ORS;  Service: Urology;  Laterality: N/A;  . NO PAST SURGERIES      Home Medications:    Medication List       Accurate as of 11/25/15  9:39 AM. Always use your most recent med list.          acetaminophen 500 MG tablet Commonly known as:  TYLENOL Take 500 mg by mouth every 6 (six) hours as needed for mild pain.   cephALEXin 500 MG capsule Commonly known as:  KEFLEX Take 1 capsule (500 mg total) by mouth 3 (three) times daily.   FARXIGA 10 MG Tabs tablet Generic drug:  dapagliflozin propanediol Take 10 mg by mouth every evening.   glimepiride 2 MG tablet Commonly known as:  AMARYL Take 2 mg by mouth 2 (two) times daily.   HYDROcodone-acetaminophen 5-325 MG tablet Commonly known as:  NORCO Take 1-2 tablets by mouth every 4 (four) hours as needed for moderate pain.   lisinopril 10 MG tablet Commonly known as:  PRINIVIL,ZESTRIL Take 10 mg by mouth every morning.   metFORMIN 1000 MG (MOD) 24 hr tablet Commonly known as:  GLUMETZA Take 500 mg by mouth 2 (two) times daily with a meal.   VICTOZA 18 MG/3ML Sopn Generic drug:   liraglutide Inject 1.8 mg into the skin daily.       Allergies: No Known Allergies  Family History: Family History  Problem Relation Age of Onset  . Prostate cancer Neg Hx   . Kidney cancer Neg Hx     Social History:  reports that he quit smoking about 2 years ago. His smoking use included Cigarettes. He has a 20.00 pack-year smoking history. He has never used smokeless tobacco. He reports that he drinks about 1.2 oz of alcohol per week . He reports that he does not use drugs.  ROS: UROLOGY Frequent Urination?: No Hard to postpone urination?: No Burning/pain with urination?: No Get up at night to urinate?: No Leakage of urine?: No Urine stream starts and stops?: No Trouble starting stream?: No Do you have to strain to urinate?: No Blood in urine?: No Urinary tract infection?: No Sexually transmitted disease?: No Injury to kidneys or bladder?: No Painful intercourse?: No Weak stream?: No Erection problems?: No Penile pain?: No  Gastrointestinal Nausea?: No Vomiting?: No Indigestion/heartburn?: No Diarrhea?: No Constipation?: No  Constitutional Fever: No Night sweats?: No Weight loss?: No Fatigue?: No  Skin Skin rash/lesions?: No Itching?: No  Eyes Blurred vision?: No Double vision?: No  Ears/Nose/Throat Sore throat?: No Sinus problems?: No  Hematologic/Lymphatic Swollen glands?: No Easy bruising?: No  Cardiovascular Leg swelling?: No  Chest pain?: No  Respiratory Cough?: No Shortness of breath?: No  Endocrine Excessive thirst?: No  Musculoskeletal Back pain?: No Joint pain?: No  Neurological Headaches?: No Dizziness?: No  Psychologic Depression?: No Anxiety?: No  Physical Exam: BP 114/77 (BP Location: Left Arm, Patient Position: Sitting, Cuff Size: Large)   Pulse 88   Ht 5\' 11"  (1.803 m)   Wt 224 lb 3.2 oz (101.7 kg)   BMI 31.27 kg/m   Constitutional:  Alert and oriented, No acute distress. HEENT: Accident AT, moist mucus  membranes.  Trachea midline, no masses. Cardiovascular: No clubbing, cyanosis, or edema. Respiratory: Normal respiratory effort, no increased work of breathing. GI: Abdomen is soft, nontender, nondistended, no abdominal masses GU: No CVA tenderness. Circumcision healing well. No drainage. His frenulum is still intact as it was left like that purposely during surgery. Skin: No rashes, bruises or suspicious lesions. Lymph: No cervical or inguinal adenopathy. Neurologic: Grossly intact, no focal deficits, moving all 4 extremities. Psychiatric: Normal mood and affect.  Laboratory Data: No results found for: WBC, HGB, HCT, MCV, PLT  Lab Results  Component Value Date   CREATININE 0.80 10/24/2015    No results found for: PSA  No results found for: TESTOSTERONE  No results found for: HGBA1C  Urinalysis No results found for: COLORURINE, APPEARANCEUR, LABSPEC, PHURINE, GLUCOSEU, HGBUR, BILIRUBINUR, KETONESUR, PROTEINUR, UROBILINOGEN, NITRITE, LEUKOCYTESUR   Assessment & Plan:    1. Phimosis Circumcision healing well. His frenulum wasleft intact during the procedure. If he is by this once he ha fully healed, we can incise his frenulum in the office. He'll otherwise follow-up when necessary.  Return if symptoms worsen or fail to improve.  Hildred LaserBrian James Joee Iovine, MD  [facility] [address], [address] [place], [place] [address] [phone]

## 2016-01-11 ENCOUNTER — Encounter: Payer: Self-pay | Admitting: Physician Assistant

## 2016-01-11 ENCOUNTER — Ambulatory Visit: Payer: Self-pay | Admitting: Physician Assistant

## 2016-01-11 VITALS — BP 120/72 | HR 120 | Temp 102.6°F

## 2016-01-11 DIAGNOSIS — J101 Influenza due to other identified influenza virus with other respiratory manifestations: Secondary | ICD-10-CM

## 2016-01-11 LAB — POCT INFLUENZA A/B
Influenza A, POC: POSITIVE — AB
Influenza B, POC: POSITIVE — AB

## 2016-01-11 MED ORDER — OSELTAMIVIR PHOSPHATE 75 MG PO CAPS: 75.0000 mg | ORAL_CAPSULE | Freq: Two times a day (BID) | ORAL | 0 refills | Status: DC

## 2016-01-11 MED ORDER — HYDROCOD POLST-CPM POLST ER 10-8 MG/5ML PO SUER: 5.0000 mL | Freq: Two times a day (BID) | ORAL | 0 refills | Status: DC | PRN

## 2016-01-11 NOTE — Progress Notes (Signed)
S: C/o runny nose and congestion with dry cough for 5-6 days, + fever, chills, denies cp/sob, v/d;  cough is sporadic, worse at night  Using otc meds: tussin, nyquil  O: PE: vitals w elevated temp at 102.6, others  wnl, nad,  perrl eomi, normocephalic, tms dull, nasal mucosa red and swollen, throat injected, neck supple no lymph, lungs c t a, cv rrr, neuro intact, flu swab + A and B  A:  Acute influenza A and B   P: drink fluids, continue regular meds , use otc meds of choice, return if not improving in 5 days, return earlier if worsening , tamiflu, tussionex

## 2016-02-20 DIAGNOSIS — E119 Type 2 diabetes mellitus without complications: Secondary | ICD-10-CM | POA: Diagnosis not present

## 2016-02-27 DIAGNOSIS — E119 Type 2 diabetes mellitus without complications: Secondary | ICD-10-CM | POA: Diagnosis not present

## 2016-07-25 ENCOUNTER — Ambulatory Visit: Payer: Self-pay | Admitting: Physician Assistant

## 2016-07-25 ENCOUNTER — Encounter: Payer: Self-pay | Admitting: Physician Assistant

## 2016-07-25 VITALS — BP 102/74 | HR 114 | Temp 98.6°F

## 2016-07-25 DIAGNOSIS — R000 Tachycardia, unspecified: Secondary | ICD-10-CM

## 2016-07-25 DIAGNOSIS — J069 Acute upper respiratory infection, unspecified: Secondary | ICD-10-CM

## 2016-07-25 MED ORDER — AMOXICILLIN 875 MG PO TABS: 875.0000 mg | ORAL_TABLET | Freq: Two times a day (BID) | ORAL | 0 refills | Status: DC

## 2016-07-25 NOTE — Progress Notes (Signed)
S: C/o sore throat, runny nose and congestion for 3 days, + fever, chills, and body aches, temp was around 101.4, denies cp/sob, v/d; mucus is green when he blows his nose, but his cough is dry, states he has been using ibuprofen and nicotine patch.  No swelling in feet or ankles, no sudafed, no known tick bite  Using otc meds: ibuprofen  O: PE: vitals wnl, nad, perrl eomi, normocephalic, tms dull, nasal mucosa red and swollen, throat injected, neck supple no lymph, lungs c t a, cv rrr, neuro intact, ekg shows tachycardia, some changes from old ekg  A:  Acute  uri   P: drink fluids, continue regular meds , use otc meds of choice, return if not improving in 5 days, return earlier if worsening , amoxil, pt to see his pcp for eval, if any cp or sob pt is to go to ER asap

## 2016-08-21 DIAGNOSIS — E119 Type 2 diabetes mellitus without complications: Secondary | ICD-10-CM | POA: Diagnosis not present

## 2016-08-27 DIAGNOSIS — E119 Type 2 diabetes mellitus without complications: Secondary | ICD-10-CM | POA: Diagnosis not present

## 2017-03-28 ENCOUNTER — Ambulatory Visit (INDEPENDENT_AMBULATORY_CARE_PROVIDER_SITE_OTHER): Payer: Self-pay | Admitting: Family Medicine

## 2017-03-28 VITALS — BP 122/78 | HR 84 | Temp 97.6°F | Wt 217.0 lb

## 2017-03-28 DIAGNOSIS — R059 Cough, unspecified: Secondary | ICD-10-CM

## 2017-03-28 DIAGNOSIS — R05 Cough: Secondary | ICD-10-CM

## 2017-03-28 DIAGNOSIS — J019 Acute sinusitis, unspecified: Secondary | ICD-10-CM

## 2017-03-28 DIAGNOSIS — H1031 Unspecified acute conjunctivitis, right eye: Secondary | ICD-10-CM

## 2017-03-28 MED ORDER — AMOXICILLIN-POT CLAVULANATE 875-125 MG PO TABS: 1.0000 | ORAL_TABLET | Freq: Two times a day (BID) | ORAL | 0 refills | Status: DC

## 2017-03-28 MED ORDER — POLYMYXIN B-TRIMETHOPRIM 10000-0.1 UNIT/ML-% OP SOLN: 1.0000 [drp] | OPHTHALMIC | 0 refills | Status: DC

## 2017-03-28 MED ORDER — BENZONATATE 200 MG PO CAPS: 200.0000 mg | ORAL_CAPSULE | Freq: Three times a day (TID) | ORAL | 0 refills | Status: DC | PRN

## 2017-03-28 MED ORDER — IPRATROPIUM BROMIDE 0.03 % NA SOLN: 2.0000 | Freq: Two times a day (BID) | NASAL | 0 refills | Status: DC

## 2017-03-28 NOTE — Patient Instructions (Signed)
Sinusitis, Adult Sinusitis is soreness and inflammation of your sinuses. Sinuses are hollow spaces in the bones around your face. They are located:  Around your eyes.  In the middle of your forehead.  Behind your nose.  In your cheekbones.  Your sinuses and nasal passages are lined with a stringy fluid (mucus). Mucus normally drains out of your sinuses. When your nasal tissues get inflamed or swollen, the mucus can get trapped or blocked so air cannot flow through your sinuses. This lets bacteria, viruses, and funguses grow, and that leads to infection. Follow these instructions at home: Medicines  Take, use, or apply over-the-counter and prescription medicines only as told by your doctor. These may include nasal sprays.  If you were prescribed an antibiotic medicine, take it as told by your doctor. Do not stop taking the antibiotic even if you start to feel better. Hydrate and Humidify  Drink enough water to keep your pee (urine) clear or pale yellow.  Use a cool mist humidifier to keep the humidity level in your home above 50%.  Breathe in steam for 10-15 minutes, 3-4 times a day or as told by your doctor. You can do this in the bathroom while a hot shower is running.  Try not to spend time in cool or dry air. Rest  Rest as much as possible.  Sleep with your head raised (elevated).  Make sure to get enough sleep each night. General instructions  Put a warm, moist washcloth on your face 3-4 times a day or as told by your doctor. This will help with discomfort.  Wash your hands often with soap and water. If there is no soap and water, use hand sanitizer.  Do not smoke. Avoid being around people who are smoking (secondhand smoke).  Keep all follow-up visits as told by your doctor. This is important. Contact a doctor if:  You have a fever.  Your symptoms get worse.  Your symptoms do not get better within 10 days. Get help right away if:  You have a very bad  headache.  You cannot stop throwing up (vomiting).  You have pain or swelling around your face or eyes.  You have trouble seeing.  You feel confused.  Your neck is stiff.  You have trouble breathing. This information is not intended to replace advice given to you by your health care provider. Make sure you discuss any questions you have with your health care provider. Document Released: 06/13/2007 Document Revised: 08/21/2015 Document Reviewed: 10/20/2014 Elsevier Interactive Patient Education  2018 [employer] Inc[employer] Bacterial Conjunctivitis Bacterial conjunctivitis is an infection of your conjunctiva. This is the clear membrane that covers the white part of your eye and the inner surface of your eyelid. This condition can make your eye:  Red or pink.  Itchy.  This condition is caused by bacteria. This condition spreads very easily from person to person (is contagious) and from one eye to the other eye. Follow these instructions at home: Medicines  Take or apply your antibiotic medicine as told by your doctor. Do not stop taking or applying the antibiotic even if you start to feel better.  Take or apply over-the-counter and prescription medicines only as told by your doctor.  Do not touch your eyelid with the eye drop bottle or the ointment tube. Managing discomfort  Wipe any fluid from your eye with a warm, wet washcloth or a cotton ball.  Place a cool, clean washcloth on your eye. Do this for 10-20 minutes, 3-4  times per day. General instructions  Do not wear contact lenses until the irritation is gone. Wear glasses until your doctor says it is okay to wear contacts.  Do not wear eye makeup until your symptoms are gone. Throw away any old makeup.  Change or wash your pillowcase every day.  Do not share towels or washcloths with anyone.  Wash your hands often with soap and water. Use paper towels to dry your hands.  Do not touch or rub your eyes.  Do not drive or use  heavy machinery if your vision is blurry. Contact a doctor if:  You have a fever.  Your symptoms do not get better after 10 days. Get help right away if:  You have a fever and your symptoms suddenly get worse.  You have very bad pain when you move your eye.  Your face: ? Hurts. ? Is red. ? Is swollen.  You have sudden loss of vision. This information is not intended to replace advice given to you by your health care provider. Make sure you discuss any questions you have with your health care provider. Document Released: 10/04/2007 Document Revised: 06/02/2015 Document Reviewed: 10/07/2014 Elsevier Interactive Patient Education  2018 Elsevier Inc.  

## 2017-03-28 NOTE — Progress Notes (Signed)
Vincent Carlson is a 42 y.o. male who presents with 5 days of sinus congestion symptoms and cough. He reports thick mucus upon awakening.   Review of Systems  Constitutional: Positive for fever and malaise/fatigue. Negative for chills.  HENT: Positive for congestion and sore throat.   Eyes: Negative.   Respiratory: Positive for cough and sputum production.   Cardiovascular: Negative.   Gastrointestinal: Negative.   Genitourinary: Negative.   Musculoskeletal: Negative.   Neurological: Negative.   Endo/Heme/Allergies: Negative.   Psychiatric/Behavioral: Negative.     O: Vitals:   03/28/17 1612  BP: 122/78  Pulse: 84  Temp: 97.6 F (36.4 C)   Physical Exam  Constitutional: He is oriented to person, place, and time. Vital signs are normal. He appears well-developed and well-nourished.  HENT:  Head: Normocephalic and atraumatic.  Right Ear: Hearing, tympanic membrane, external ear and ear canal normal.  Left Ear: Hearing, tympanic membrane, external ear and ear canal normal.  Nose: Rhinorrhea present. Right sinus exhibits maxillary sinus tenderness. Left sinus exhibits maxillary sinus tenderness.  Mouth/Throat: Uvula is midline. Posterior oropharyngeal erythema present. Tonsils are 2+ on the right. Tonsils are 2+ on the left. No tonsillar exudate.  Eyes: Pupils are equal, round, and reactive to light.  Neck: Normal range of motion.  Cardiovascular: Normal rate and regular rhythm.  Pulmonary/Chest: Effort normal and breath sounds normal.  Abdominal: Soft. Bowel sounds are normal.  Lymphadenopathy:       Head (right side): Submandibular adenopathy present. No submental and no tonsillar adenopathy present.       Head (left side): Submandibular adenopathy present. No submental and no tonsillar adenopathy present.    He has cervical adenopathy.  Neurological: He is alert and oriented to person, place, and time.  Skin: Skin is warm and dry.  Psychiatric: He has a normal mood and affect.   Vitals reviewed.   A: 1. Acute non-recurrent sinusitis, unspecified location   2. Cough   3. Acute conjunctivitis of right eye, unspecified acute conjunctivitis type    P: Diagnosis etiology and medication use and indications reviewed with patient who agrees with POC at this time. No acute concerns. F/U if symptoms persist or are unimproved.  1. Acute non-recurrent sinusitis, unspecified location - amoxicillin-clavulanate (AUGMENTIN) 875-125 MG tablet; Take 1 tablet by mouth 2 (two) times daily. - ipratropium (ATROVENT) 0.03 % nasal spray; Place 2 sprays into both nostrils every 12 (twelve) hours.  2. Cough - benzonatate (TESSALON) 200 MG capsule; Take 1 capsule (200 mg total) by mouth 3 (three) times daily as needed for cough (with full glass of water).  3. Acute conjunctivitis of right eye, unspecified acute conjunctivitis type - trimethoprim-polymyxin b (POLYTRIM) ophthalmic solution; Place 1 drop into the right eye every 4 (four) hours.

## 2017-07-12 IMAGING — CR DG CHEST 1V
1 series · 1 of 1 positions shown · non-contrast
Comparison: None.

CLINICAL DATA: Prior positive TB tests

EXAM:
CHEST  1 VIEW

[chest pa]
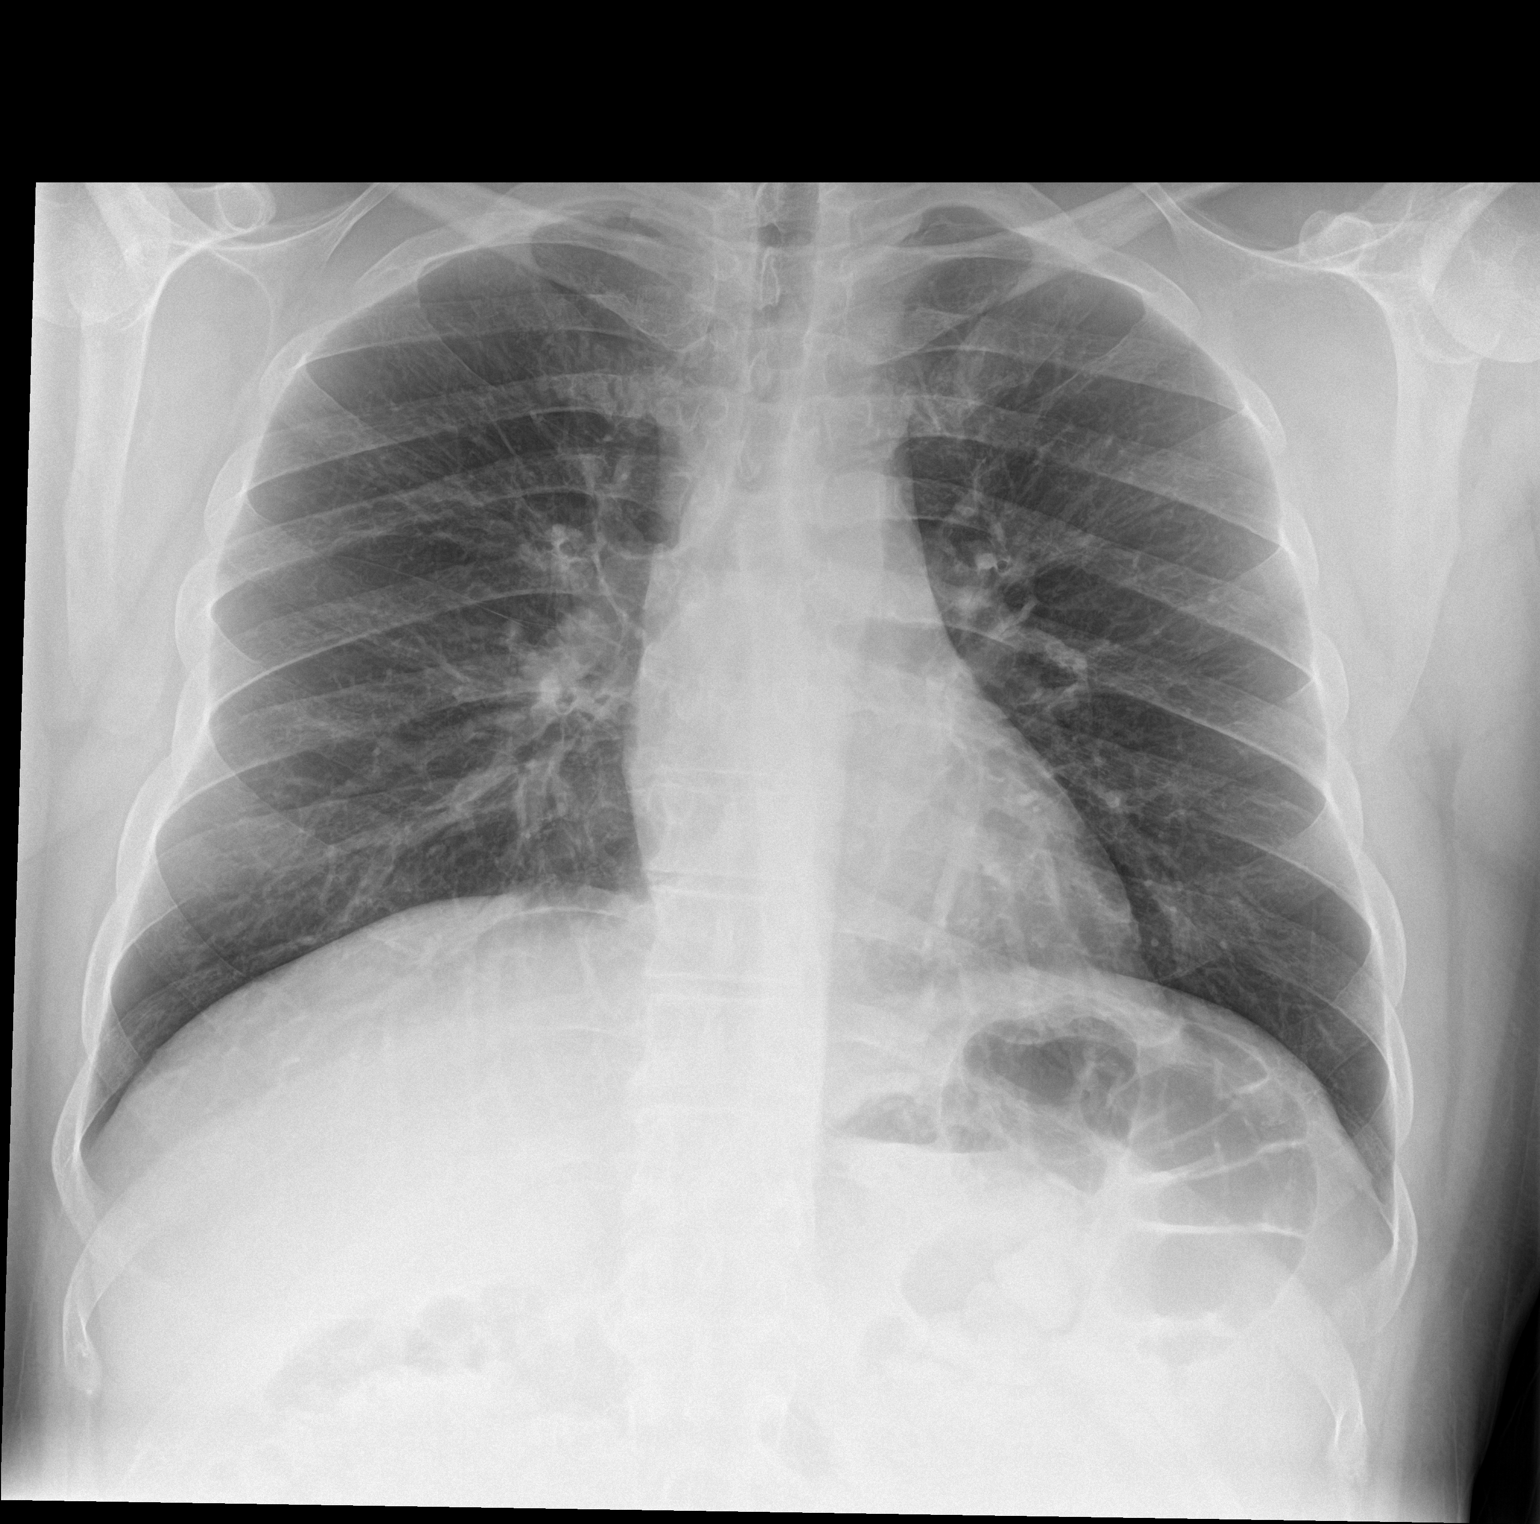

[1 of 1 positions shown; findings below may reference images not displayed]

FINDINGS: No active infiltrate or effusion is seen. No sequela of prior
tuberculous infection is seen. There is some peribronchial
thickening which may indicate bronchitis. The heart is within normal
limits in size. No bony abnormality is seen.
IMPRESSION: No active lung disease. Peribronchial thickening may indicate
bronchitis.

## 2017-07-17 ENCOUNTER — Ambulatory Visit (INDEPENDENT_AMBULATORY_CARE_PROVIDER_SITE_OTHER): Payer: Self-pay | Admitting: Family Medicine

## 2017-07-17 DIAGNOSIS — Z23 Encounter for immunization: Secondary | ICD-10-CM

## 2017-07-17 NOTE — Progress Notes (Signed)
Patien in office today for TDAP only given IM right deltoid with no adverse reaction. VIS given and questionare completed

## 2018-02-03 ENCOUNTER — Ambulatory Visit (INDEPENDENT_AMBULATORY_CARE_PROVIDER_SITE_OTHER): Payer: Self-pay | Admitting: Physician Assistant

## 2018-02-03 ENCOUNTER — Encounter: Payer: Self-pay | Admitting: Physician Assistant

## 2018-02-03 VITALS — BP 112/80 | HR 117 | Temp 100.7°F | Resp 20 | Ht 70.0 in | Wt 214.0 lb

## 2018-02-03 DIAGNOSIS — J111 Influenza due to unidentified influenza virus with other respiratory manifestations: Secondary | ICD-10-CM

## 2018-02-03 MED ORDER — OSELTAMIVIR PHOSPHATE 75 MG PO CAPS: 75.0000 mg | ORAL_CAPSULE | Freq: Two times a day (BID) | ORAL | 0 refills | Status: AC

## 2018-02-03 MED ORDER — GUAIFENESIN-DM 100-10 MG/5ML PO SYRP
5.0000 mL | ORAL_SOLUTION | ORAL | 0 refills | Status: DC | PRN
Start: 2018-02-03 — End: 2021-03-08

## 2018-02-03 MED ORDER — BENZONATATE 200 MG PO CAPS: 200.0000 mg | ORAL_CAPSULE | Freq: Two times a day (BID) | ORAL | 0 refills | Status: DC | PRN

## 2018-02-03 NOTE — Patient Instructions (Signed)
Thank you for choosing [facility] for your health care needs.  You have been diagnosed with influenza (empirically, based on your current symptoms). No rapid flu test performed.  You have been prescribed Tamiflu, Tessalon Perles, and Robitussin-DM.  Increase fluids. Rest. Take over the counter Tylenol or ibuprofen for fever.  [facility] or follow-up with family physician or at urgent care in one week if symptoms not improving, sooner with any worsening symptoms.  Influenza, Adult Influenza is also called "the flu." It is an infection in the lungs, nose, and throat (respiratory tract). It is caused by a virus. The flu causes symptoms that are similar to symptoms of a cold. It also causes a high fever and body aches. The flu spreads easily from person to person (is contagious). Getting a flu shot (influenza vaccination) every year is the best way to prevent the flu. What are the causes? This condition is caused by the influenza virus. You can get the virus by:  Breathing in droplets that are in the air from the cough or sneeze of a person who has the virus.  Touching something that has the virus on it (is contaminated) and then touching your mouth, nose, or eyes. What increases the risk? Certain things may make you more likely to get the flu. These include:  Not washing your hands often.  Having close contact with many people during cold and flu season.  Touching your mouth, eyes, or nose without first washing your hands.  Not getting a flu shot every year. You may have a higher risk for the flu, along with serious problems such as a lung infection (pneumonia), if you:  Are older than 65.  Are pregnant.  Have a weakened disease-fighting system (immune system) because of a disease or taking certain medicines.  Have a long-term (chronic) illness, such as: ? Heart, kidney, or lung disease. ? Diabetes. ? Asthma.  Have a liver disorder.  Are very overweight (morbidly  obese).  Have anemia. This is a condition that affects your red blood cells. What are the signs or symptoms? Symptoms usually begin suddenly and last 4-14 days. They may include:  Fever and chills.  Headaches, body aches, or muscle aches.  Sore throat.  Cough.  Runny or stuffy (congested) nose.  Chest discomfort.  Not wanting to eat as much as normal (poor appetite).  Weakness or feeling tired (fatigue).  Dizziness.  Feeling sick to your stomach (nauseous) or throwing up (vomiting). How is this treated? If the flu is found early, you can be treated with medicine that can help reduce how bad the illness is and how long it lasts (antiviral medicine). This may be given by mouth (orally) or through an IV tube. Taking care of yourself at home can help your symptoms get better. Your doctor may suggest:  Taking over-the-counter medicines.  Drinking plenty of fluids. The flu often goes away on its own. If you have very bad symptoms or other problems, you may be treated in a hospital. Follow these instructions at home:     Activity  Rest as needed. Get plenty of sleep.  Stay home from work or school as told by your doctor. ? Do not leave home until you do not have a fever for 24 hours without taking medicine. ? Leave home only to visit your doctor. Eating and drinking  Take an ORS (oral rehydration solution). This is a drink that is sold at pharmacies and stores.  Drink enough fluid to keep your  pee (urine) pale yellow.  Drink clear fluids in small amounts as you are able. Clear fluids include: ? Water. ? Ice chips. ? Fruit juice that has water added (diluted fruit juice). ? Low-calorie sports drinks.  Eat bland, easy-to-digest foods in small amounts as you are able. These foods include: ? Bananas. ? Applesauce. ? Rice. ? Lean meats. ? Toast. ? Crackers.  Do not eat or drink: ? Fluids that have a lot of sugar or caffeine. ? Alcohol. ? Spicy or fatty  foods. General instructions  Take over-the-counter and prescription medicines only as told by your doctor.  Use a cool mist humidifier to add moisture to the air in your home. This can make it easier for you to breathe.  Cover your mouth and nose when you cough or sneeze.  Wash your hands with soap and water often, especially after you cough or sneeze. If you cannot use soap and water, use alcohol-based hand sanitizer.  Keep all follow-up visits as told by your doctor. This is important. How is this prevented?   Get a flu shot every year. You may get the flu shot in late summer, fall, or winter. Ask your doctor when you should get your flu shot.  Avoid contact with people who are sick during fall and winter (cold and flu season). Contact a doctor if:  You get new symptoms.  You have: ? Chest pain. ? Watery poop (diarrhea). ? A fever.  Your cough gets worse.  You start to have more mucus.  You feel sick to your stomach.  You throw up. Get help right away if you:  Have shortness of breath.  Have trouble breathing.  Have skin or nails that turn a bluish color.  Have very bad pain or stiffness in your neck.  Get a sudden headache.  Get sudden pain in your face or ear.  Cannot eat or drink without throwing up. Summary  Influenza ("the flu") is an infection in the lungs, nose, and throat. It is caused by a virus.  Take over-the-counter and prescription medicines only as told by your doctor.  Getting a flu shot every year is the best way to avoid getting the flu. This information is not intended to replace advice given to you by your health care provider. Make sure you discuss any questions you have with your health care provider. Document Released: 10/04/2007 Document Revised: 06/12/2017 Document Reviewed: 06/12/2017 Elsevier Interactive Patient Education  2019 [employer].

## 2018-02-03 NOTE — Progress Notes (Signed)
Patient ID: Vincent Carlson DOB: 04/28/1975 AGE: 43 y.o. MRN: [id number]   PCP: Jerl MinaHedrick, James, MD   Chief Complaint:  Chief Complaint  Patient presents with  . Generalized BoCarmelia Rollerdy Aches    x1d  . Sinusitis    x1d  . Cough    x1d     Subjective:    HPI:  Vincent Carlson is a 43 y.o. male presents for evaluation  Chief Complaint  Patient presents with  . Generalized Body Aches    x1d  . Sinusitis    x1d  . Cough    x561d    43 year old male presents with two day history of flu-like symptoms. Began yesterday afternoon, 1:30pm, with cough. Dry cough. Associated scratchy throat and sinus pain. By the evening developed fever (wife stated he felt warm, did not take temperature), chills, sweats, ear fullness/pressure, and headache. This morning had associated body aches and lack of appetite. Has not taken any OTC medication for symptom relief. Denies dizziness/lightheadedness, tinnitus, nasal congestion, rhinorrhea, sore throat, chest pain, SOB, wheezing, abdominal pain, nausea/vomiting, diarrhea. 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 Patient received this season's influenza. No known specific flu exposure.  Patient with influenza two years ago, 01/11/2016. [facility] at [facility], positive rapid flu test for A and B, prescribed Tamiflu and Tussionex.  Patient is a Type 2 diabetic. Managed by with [facility]inology [facility]. Last seen on 07/16/2017. A1C was 7.1%. On Metformin, Victoza, Glimepiride, and [place]xiga. Patient is a former cigarette smoker, quit 7 years ago. In previous Epic note, patient states still smokes marijuana weekly.  A limited review of symptoms was performed, pertinent positives and negatives as mentioned in HPI.  The following portions of the patient's history were reviewed and updated as appropriate: allergies, current medications and past medical history.  Patient Active Problem List   Diagnosis Date Noted  . Cannot sleep  07/29/2015  . Diabetes mellitus (HCC) 07/29/2015  . Essential (primary) hypertension 07/29/2015    No Known Allergies  Current Outpatient Medications on File Prior to Visit  Medication Sig Dispense Refill  . acetaminophen (TYLENOL) 500 MG tablet Take 500 mg by mouth every 6 (six) hours as needed for mild pain.    . Continuous Blood Gluc Receiver (FREESTYLE LIBRE 14 DAY READER) DEVI Use 1 each as directed    . Continuous Blood Gluc Sensor (FREESTYLE LIBRE SENSOR SYSTEM) MISC USE 1 EACH EVERY 14 (FOURTEEN) DAYS    . dapagliflozin propanediol (FARXIGA) 10 MG TABS tablet Take 10 mg by mouth every evening.     Marland Kitchen. glimepiride (AMARYL) 2 MG tablet Take 2 mg by mouth 2 (two) times daily.    Marland Kitchen. lisinopril (PRINIVIL,ZESTRIL) 10 MG tablet Take 10 mg by mouth every morning.     . metFORMIN (GLUMETZA) 1000 MG (MOD) 24 hr tablet Take 500 mg by mouth 2 (two) times daily with a meal.     . VICTOZA 18 MG/3ML SOPN Inject 1.8 mg into the skin daily.      No current facility-administered medications on file prior to visit.        Objective:   Vitals:   02/03/18 1546  BP: 112/80  Pulse: (!) 117  Resp: 20  Temp: (!) 100.7 F (38.2 C)  SpO2: 97%     Wt Readings from Last 3 Encounters:  02/03/18 214 lb (97.1 kg)  03/28/17 217 lb (98.4 kg)  11/25/15 224 lb 3.2 oz (101.7 kg)    Physical  Exam:   General Appearance:  Patient sitting comfortably on examination table. Conversational. Peri JeffersonGood self-historian. In no acute distress. 100.10F temperature. Warm to the touch.  Head:  Normocephalic, without obvious abnormality, atraumatic  Eyes:  PERRL, conjunctiva/corneas clear, EOM's intact  Ears:  Bilateral ear canals WNL. No erythema or edema. No discharge/drainage. Bilateral TMs WNL. No erythema, injection, or serous effusion. No scar tissue.  Nose: Nares normal, septum midline. No discharge. Normal mucosa. No sinus tenderness with percussion/palpation.  Throat: Lips, mucosa, and tongue normal; teeth and  gums normal. Throat reveals no erythema. Tonsils with no enlargement or exudate.  Neck: Supple, symmetrical, trachea midline, no adenopathy  Lungs:   Clear to auscultation bilaterally, respirations unlabored. Good aeration. No wheezing, rales, rhonchi, or crackles. No cough elicited with deep inspiration.  Heart:  Tachycardia; regular rhythm. No murmur, rub, or gallop  Extremities: Extremities normal, atraumatic, no cyanosis or edema  Pulses: 2+ and symmetric  Skin: Skin color, texture, turgor normal, no rashes or lesions  Lymph nodes: Cervical, supraclavicular, and axillary nodes normal  Neurologic: Normal    Assessment & Plan:    Exam findings, diagnosis etiology and medication use and indications reviewed with patient. Follow-Up and discharge instructions provided. No emergent/urgent issues found on exam.  Patient education was provided.   Patient verbalized understanding of information provided and agrees with plan of care (POC), all questions answered. The patient is advised to call or return to clinic if condition does not see an improvement in symptoms, or to seek the care of the closest emergency department if condition worsens with the below plan.    1. Influenza  - oseltamivir (TAMIFLU) 75 MG capsule; Take 1 capsule (75 mg total) by mouth 2 (two) times daily for 5 days.  Dispense: 10 capsule; Refill: 0 - benzonatate (TESSALON) 200 MG capsule; Take 1 capsule (200 mg total) by mouth 2 (two) times daily as needed for cough.  Dispense: 20 capsule; Refill: 0 - guaiFENesin-dextromethorphan (ROBITUSSIN DM) 100-10 MG/5ML syrup; Take 5 mLs by mouth every 4 (four) hours as needed for cough.  Dispense: 118 mL; Refill: 0  Patient with just over 24 hour history of flu-like symptoms. Fever, chills, bodyaches, headache, sore throat, cough. Febrile and tachycardic on exam today. VSS. In no acute distress. Clear lung sounds. 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 Symptoms classic for  influenza. Discussed with patient, at this time will treat patient empirically for flu. Prescribed Tamiflu 75mg  bid x 5 days, Tessalon Perles, and Robitussin-DM. Advised rest, increase fluids, and OTC antipyretics. Discussed contagiousness; gave patient one week off from work. Discussed flu complications. Advised patient be re-seen in one week if symptoms not improving, sooner with any worsening symptoms, by [facility], [facility]CP, or urgent care. Patient agrees.   Janalyn HarderSamantha Alethea Terhaar, MHS, PA-C Rulon SeraSamantha F. Anik Wesch, MHS, PA-C Advanced Practice Provider [facility]  [facility]  [address], [facility], 1st Floor [place], [place] [address] (p):  [phone] [email or web address]

## 2018-02-05 ENCOUNTER — Telehealth: Payer: Self-pay | Admitting: Emergency Medicine

## 2018-02-05 NOTE — Telephone Encounter (Signed)
Spoke with patient whom informed me that he is doing so mch better. Following up on visit with [facility].

## 2019-04-20 ENCOUNTER — Other Ambulatory Visit: Payer: Self-pay | Admitting: Internal Medicine

## 2019-12-30 ENCOUNTER — Other Ambulatory Visit: Payer: Self-pay | Admitting: Internal Medicine

## 2020-02-19 ENCOUNTER — Other Ambulatory Visit: Payer: Self-pay | Admitting: Internal Medicine

## 2020-03-15 ENCOUNTER — Other Ambulatory Visit: Payer: Self-pay | Admitting: Family Medicine

## 2020-03-25 ENCOUNTER — Other Ambulatory Visit: Payer: Self-pay

## 2020-04-29 ENCOUNTER — Other Ambulatory Visit: Payer: Self-pay

## 2020-04-29 MED ORDER — NICOTINE 14 MG/24HR TD PT24
MEDICATED_PATCH | TRANSDERMAL | 0 refills | Status: DC
  Filled 2020-04-29: qty 28, 28d supply, fill #0

## 2020-05-13 ENCOUNTER — Other Ambulatory Visit (HOSPITAL_COMMUNITY): Payer: Self-pay

## 2020-06-08 ENCOUNTER — Other Ambulatory Visit: Payer: Self-pay

## 2020-06-08 MED ORDER — NICOTINE 14 MG/24HR TD PT24
MEDICATED_PATCH | TRANSDERMAL | 0 refills | Status: DC
  Filled 2020-06-08: qty 28, 28d supply, fill #0

## 2020-06-08 MED FILL — Metformin HCl Tab ER 24HR 500 MG: ORAL | 90 days supply | Qty: 360 | Fill #0 | Status: AC

## 2020-06-08 MED FILL — Lisinopril Tab 10 MG: ORAL | 90 days supply | Qty: 90 | Fill #0 | Status: AC

## 2020-06-08 MED FILL — Dapagliflozin Propanediol Tab 10 MG (Base Equivalent): ORAL | 90 days supply | Qty: 90 | Fill #0 | Status: AC

## 2020-06-08 MED FILL — Atorvastatin Calcium Tab 20 MG (Base Equivalent): ORAL | 90 days supply | Qty: 90 | Fill #0 | Status: AC

## 2020-06-08 MED FILL — Glimepiride Tab 2 MG: ORAL | 90 days supply | Qty: 180 | Fill #0 | Status: AC

## 2020-06-09 ENCOUNTER — Other Ambulatory Visit: Payer: Self-pay

## 2020-06-09 MED ORDER — SEMAGLUTIDE (1 MG/DOSE) 4 MG/3ML ~~LOC~~ SOPN
PEN_INJECTOR | SUBCUTANEOUS | 2 refills | Status: AC
  Filled 2020-06-09: qty 9, 84d supply, fill #0
  Filled 2020-08-19 – 2020-09-05 (×2): qty 9, 84d supply, fill #1
  Filled 2020-11-25: qty 3, 28d supply, fill #2

## 2020-06-28 ENCOUNTER — Other Ambulatory Visit: Payer: Self-pay

## 2020-06-28 MED ORDER — NICOTINE 14 MG/24HR TD PT24
MEDICATED_PATCH | TRANSDERMAL | 0 refills | Status: DC
  Filled 2020-06-28: qty 28, 28d supply, fill #0

## 2020-06-28 MED FILL — Continuous Glucose System Sensor: 84 days supply | Qty: 6 | Fill #0 | Status: AC

## 2020-06-30 ENCOUNTER — Other Ambulatory Visit: Payer: Self-pay

## 2020-07-15 ENCOUNTER — Other Ambulatory Visit (HOSPITAL_COMMUNITY): Payer: Self-pay

## 2020-07-28 ENCOUNTER — Other Ambulatory Visit: Payer: Self-pay

## 2020-08-19 ENCOUNTER — Other Ambulatory Visit: Payer: Self-pay

## 2020-08-24 ENCOUNTER — Other Ambulatory Visit: Payer: Self-pay

## 2020-08-24 MED ORDER — NICOTINE 14 MG/24HR TD PT24
MEDICATED_PATCH | TRANSDERMAL | 0 refills | Status: DC
  Filled 2020-08-24: qty 28, 28d supply, fill #0

## 2020-08-26 ENCOUNTER — Other Ambulatory Visit: Payer: Self-pay

## 2020-09-01 ENCOUNTER — Other Ambulatory Visit: Payer: Self-pay

## 2020-09-01 MED ORDER — CARESTART COVID-19 HOME TEST VI KIT
PACK | 0 refills | Status: AC
  Filled 2020-09-01: qty 2, 4d supply, fill #0

## 2020-09-05 ENCOUNTER — Other Ambulatory Visit: Payer: Self-pay

## 2020-09-05 MED FILL — Atorvastatin Calcium Tab 20 MG (Base Equivalent): ORAL | 90 days supply | Qty: 90 | Fill #1 | Status: AC

## 2020-09-05 MED FILL — Dapagliflozin Propanediol Tab 10 MG (Base Equivalent): ORAL | 90 days supply | Qty: 90 | Fill #1 | Status: AC

## 2020-09-05 MED FILL — Lisinopril Tab 10 MG: ORAL | 90 days supply | Qty: 90 | Fill #1 | Status: AC

## 2020-09-05 MED FILL — Metformin HCl Tab ER 24HR 500 MG: ORAL | 90 days supply | Qty: 360 | Fill #1 | Status: AC

## 2020-09-05 MED FILL — Glimepiride Tab 2 MG: ORAL | 90 days supply | Qty: 180 | Fill #1 | Status: AC

## 2020-09-05 MED FILL — Continuous Glucose System Sensor: 84 days supply | Qty: 6 | Fill #1 | Status: AC

## 2020-09-06 ENCOUNTER — Other Ambulatory Visit: Payer: Self-pay

## 2020-09-27 ENCOUNTER — Other Ambulatory Visit: Payer: Self-pay

## 2020-09-27 MED ORDER — NICOTINE 14 MG/24HR TD PT24
MEDICATED_PATCH | TRANSDERMAL | 0 refills | Status: DC
  Filled 2020-09-27: qty 28, 28d supply, fill #0

## 2020-11-02 ENCOUNTER — Other Ambulatory Visit: Payer: Self-pay | Admitting: Pharmacist

## 2020-11-02 ENCOUNTER — Other Ambulatory Visit: Payer: Self-pay

## 2020-11-02 MED ORDER — NICOTINE 14 MG/24HR TD PT24
MEDICATED_PATCH | TRANSDERMAL | 0 refills | Status: DC
  Filled 2020-11-02: qty 28, 28d supply, fill #0

## 2020-11-10 ENCOUNTER — Other Ambulatory Visit: Payer: Self-pay

## 2020-11-10 ENCOUNTER — Ambulatory Visit: Payer: Self-pay | Attending: Internal Medicine

## 2020-11-10 DIAGNOSIS — Z23 Encounter for immunization: Secondary | ICD-10-CM

## 2020-11-10 MED ORDER — PFIZER COVID-19 VAC BIVALENT 30 MCG/0.3ML IM SUSP
INTRAMUSCULAR | 0 refills | Status: AC
  Filled 2020-11-10: qty 0.3, 1d supply, fill #0

## 2020-11-10 NOTE — Progress Notes (Signed)
   Covid-19 Vaccination Clinic  Name:  Vincent Carlson    MRN: [id number] DOB: May 23, 1975  11/10/2020  Mr. Vincent Carlson was observed post Covid-19 immunization for 15 minutes without incident. He was provided with Vaccine Information Sheet and instruction to access the V-Safe system.   Mr. Vincent Carlson was instructed to call 911 with any severe reactions post vaccine: Difficulty breathing  Swelling of face and throat  A fast heartbeat  A bad rash all over body  Dizziness and weakness   Immunizations Administered     Name Date Dose VIS Date Route   Pfizer Covid-19 Vaccine Bivalent Booster 11/10/2020  2:09 PM 0.3 mL 09/07/2020 Intramuscular   Manufacturer: [employer], [employer]   Lot: [id number]   NDC: [phone]

## 2020-11-25 ENCOUNTER — Other Ambulatory Visit: Payer: Self-pay

## 2020-11-25 MED ORDER — METFORMIN HCL ER 500 MG PO TB24
ORAL_TABLET | Freq: Two times a day (BID) | ORAL | 2 refills | Status: DC
  Filled 2020-11-25: qty 360, 90d supply, fill #0
  Filled 2021-03-06: qty 360, 90d supply, fill #1
  Filled 2021-07-06: qty 360, 90d supply, fill #2

## 2020-11-25 MED ORDER — FREESTYLE LIBRE 14 DAY SENSOR MISC
11 refills | Status: DC
  Filled 2020-11-25: qty 6, 84d supply, fill #0
  Filled 2021-04-03: qty 6, 84d supply, fill #1

## 2020-11-25 MED ORDER — LISINOPRIL 10 MG PO TABS
ORAL_TABLET | Freq: Every day | ORAL | 2 refills | Status: DC
  Filled 2020-11-25: qty 90, 90d supply, fill #0
  Filled 2021-03-06: qty 90, 90d supply, fill #1
  Filled 2021-07-06: qty 90, 90d supply, fill #2

## 2020-11-25 MED ORDER — DAPAGLIFLOZIN PROPANEDIOL 10 MG PO TABS
ORAL_TABLET | Freq: Every morning | ORAL | 2 refills | Status: DC
  Filled 2020-11-25: qty 90, 90d supply, fill #0
  Filled 2021-03-06: qty 90, 90d supply, fill #1
  Filled 2021-07-06: qty 90, 90d supply, fill #2

## 2020-11-25 MED ORDER — GLIMEPIRIDE 2 MG PO TABS
ORAL_TABLET | Freq: Two times a day (BID) | ORAL | 2 refills | Status: AC
  Filled 2020-11-25: qty 180, 90d supply, fill #0
  Filled 2021-02-14: qty 180, 90d supply, fill #1
  Filled 2021-07-06: qty 180, 90d supply, fill #2

## 2020-11-25 MED FILL — Atorvastatin Calcium Tab 20 MG (Base Equivalent): ORAL | 90 days supply | Qty: 90 | Fill #2 | Status: AC

## 2020-11-28 ENCOUNTER — Other Ambulatory Visit: Payer: Self-pay

## 2020-11-28 MED ORDER — MOUNJARO 12.5 MG/0.5ML ~~LOC~~ SOAJ
SUBCUTANEOUS | 11 refills | Status: AC
  Filled 2020-11-28: qty 2, 28d supply, fill #0
  Filled 2021-01-17: qty 2, 28d supply, fill #1
  Filled 2021-02-14: qty 2, 28d supply, fill #2
  Filled 2021-03-06: qty 4, 56d supply, fill #3
  Filled 2021-03-09: qty 2, 28d supply, fill #3

## 2020-11-29 ENCOUNTER — Other Ambulatory Visit: Payer: Self-pay

## 2020-11-30 ENCOUNTER — Other Ambulatory Visit: Payer: Self-pay

## 2020-12-08 ENCOUNTER — Other Ambulatory Visit: Payer: Self-pay

## 2020-12-08 MED ORDER — NICOTINE 14 MG/24HR TD PT24
MEDICATED_PATCH | TRANSDERMAL | 0 refills | Status: DC
  Filled 2020-12-08: qty 14, 14d supply, fill #0

## 2020-12-09 ENCOUNTER — Other Ambulatory Visit: Payer: Self-pay

## 2020-12-09 ENCOUNTER — Other Ambulatory Visit: Payer: Self-pay | Admitting: Pharmacist

## 2020-12-09 MED ORDER — NICOTINE 14 MG/24HR TD PT24
MEDICATED_PATCH | TRANSDERMAL | 0 refills | Status: DC
  Filled 2020-12-09: qty 28, 28d supply, fill #0

## 2021-01-12 ENCOUNTER — Other Ambulatory Visit: Payer: Self-pay | Admitting: Pharmacist

## 2021-01-12 ENCOUNTER — Other Ambulatory Visit: Payer: Self-pay

## 2021-01-12 MED ORDER — NICOTINE 14 MG/24HR TD PT24
MEDICATED_PATCH | TRANSDERMAL | 0 refills | Status: DC
  Filled 2021-01-12: qty 28, 28d supply, fill #0

## 2021-01-17 ENCOUNTER — Other Ambulatory Visit: Payer: Self-pay

## 2021-02-01 ENCOUNTER — Other Ambulatory Visit: Payer: Self-pay

## 2021-02-14 ENCOUNTER — Other Ambulatory Visit: Payer: Self-pay

## 2021-02-14 MED ORDER — AMOXICILLIN 500 MG PO CAPS
ORAL_CAPSULE | ORAL | 0 refills | Status: DC
  Filled 2021-02-14: qty 28, 7d supply, fill #0

## 2021-02-14 MED ORDER — IBUPROFEN 600 MG PO TABS
ORAL_TABLET | ORAL | 0 refills | Status: AC
  Filled 2021-02-14: qty 28, 7d supply, fill #0

## 2021-02-15 ENCOUNTER — Other Ambulatory Visit: Payer: Self-pay

## 2021-02-15 ENCOUNTER — Telehealth: Payer: Self-pay

## 2021-02-15 DIAGNOSIS — K2270 Barrett's esophagus without dysplasia: Secondary | ICD-10-CM

## 2021-02-15 DIAGNOSIS — Z1211 Encounter for screening for malignant neoplasm of colon: Secondary | ICD-10-CM

## 2021-02-15 MED ORDER — NA SULFATE-K SULFATE-MG SULF 17.5-3.13-1.6 GM/177ML PO SOLN
354.0000 mL | Freq: Once | ORAL | 0 refills | Status: AC
  Filled 2021-02-15: qty 354, 1d supply, fill #0

## 2021-02-15 NOTE — Telephone Encounter (Signed)
Called patient and left him a detailed message letting him know that his EGD and colonoscopy were ordered on the day of 03/15/2021 per his request. I also let him know that if he had any questions to call us back. His prescription was sent to [facility] pharmacy.

## 2021-02-16 ENCOUNTER — Other Ambulatory Visit: Payer: Self-pay

## 2021-02-21 ENCOUNTER — Telehealth: Payer: No Typology Code available for payment source

## 2021-02-21 ENCOUNTER — Other Ambulatory Visit: Payer: Self-pay

## 2021-02-21 MED ORDER — AMOXICILLIN-POT CLAVULANATE 875-125 MG PO TABS
ORAL_TABLET | ORAL | 0 refills | Status: DC
  Filled 2021-02-21: qty 14, 7d supply, fill #0

## 2021-02-23 ENCOUNTER — Other Ambulatory Visit: Payer: Self-pay

## 2021-02-23 MED ORDER — NICOTINE 14 MG/24HR TD PT24
MEDICATED_PATCH | TRANSDERMAL | 0 refills | Status: DC
  Filled 2021-02-23: qty 28, 28d supply, fill #0

## 2021-03-03 ENCOUNTER — Other Ambulatory Visit: Payer: Self-pay

## 2021-03-03 MED ORDER — AMOXICILLIN 500 MG PO CAPS
ORAL_CAPSULE | ORAL | 0 refills | Status: AC
  Filled 2021-03-03: qty 28, 7d supply, fill #0

## 2021-03-03 MED ORDER — ACETAMINOPHEN ER 650 MG PO TBCR: EXTENDED_RELEASE_TABLET | ORAL | 0 refills | Status: AC

## 2021-03-03 MED ORDER — METHYLPREDNISOLONE 4 MG PO TABS
ORAL_TABLET | ORAL | 0 refills | Status: AC
  Filled 2021-03-03: qty 21, 6d supply, fill #0

## 2021-03-06 ENCOUNTER — Other Ambulatory Visit: Payer: Self-pay

## 2021-03-06 ENCOUNTER — Other Ambulatory Visit: Payer: Self-pay | Admitting: Pharmacist

## 2021-03-06 MED FILL — Glimepiride Tab 2 MG: ORAL | 90 days supply | Qty: 270 | Fill #0 | Status: CN

## 2021-03-07 ENCOUNTER — Other Ambulatory Visit: Payer: Self-pay

## 2021-03-08 ENCOUNTER — Other Ambulatory Visit: Payer: Self-pay

## 2021-03-08 ENCOUNTER — Encounter: Payer: Self-pay | Admitting: Gastroenterology

## 2021-03-09 ENCOUNTER — Other Ambulatory Visit: Payer: Self-pay

## 2021-03-09 MED ORDER — ATORVASTATIN CALCIUM 20 MG PO TABS
ORAL_TABLET | ORAL | 3 refills | Status: DC
  Filled 2021-03-09: qty 90, 90d supply, fill #0
  Filled 2021-07-06: qty 90, 90d supply, fill #1
  Filled 2021-09-28: qty 90, 90d supply, fill #2
  Filled 2021-12-29: qty 90, 90d supply, fill #3

## 2021-03-10 ENCOUNTER — Other Ambulatory Visit (HOSPITAL_COMMUNITY): Payer: Self-pay

## 2021-03-13 ENCOUNTER — Other Ambulatory Visit: Payer: Self-pay

## 2021-03-15 ENCOUNTER — Ambulatory Visit
Admission: RE | Admit: 2021-03-15 | Discharge: 2021-03-15 | Disposition: A | Discharge status: Home / Self Care | Payer: No Typology Code available for payment source | Attending: Gastroenterology | Admitting: Gastroenterology

## 2021-03-15 ENCOUNTER — Encounter: Payer: Self-pay | Admitting: Gastroenterology

## 2021-03-15 ENCOUNTER — Encounter
Admission: RE | Disposition: A | Discharge status: Home / Self Care | Payer: Self-pay | Source: Home / Self Care | Attending: Gastroenterology

## 2021-03-15 ENCOUNTER — Other Ambulatory Visit: Payer: Self-pay

## 2021-03-15 ENCOUNTER — Ambulatory Visit: Payer: No Typology Code available for payment source | Admitting: Anesthesiology

## 2021-03-15 DIAGNOSIS — I10 Essential (primary) hypertension: Secondary | ICD-10-CM | POA: Insufficient documentation

## 2021-03-15 DIAGNOSIS — Z1211 Encounter for screening for malignant neoplasm of colon: Secondary | ICD-10-CM | POA: Insufficient documentation

## 2021-03-15 DIAGNOSIS — K219 Gastro-esophageal reflux disease without esophagitis: Secondary | ICD-10-CM | POA: Diagnosis not present

## 2021-03-15 DIAGNOSIS — E119 Type 2 diabetes mellitus without complications: Secondary | ICD-10-CM | POA: Insufficient documentation

## 2021-03-15 DIAGNOSIS — K3189 Other diseases of stomach and duodenum: Secondary | ICD-10-CM | POA: Diagnosis not present

## 2021-03-15 DIAGNOSIS — Z87891 Personal history of nicotine dependence: Secondary | ICD-10-CM | POA: Insufficient documentation

## 2021-03-15 DIAGNOSIS — K2980 Duodenitis without bleeding: Secondary | ICD-10-CM | POA: Insufficient documentation

## 2021-03-15 DIAGNOSIS — K2270 Barrett's esophagus without dysplasia: Secondary | ICD-10-CM

## 2021-03-15 HISTORY — PX: COLONOSCOPY WITH PROPOFOL: SHX5780

## 2021-03-15 HISTORY — PX: ESOPHAGOGASTRODUODENOSCOPY (EGD) WITH PROPOFOL: SHX5813

## 2021-03-15 LAB — GLUCOSE, CAPILLARY: Glucose-Capillary: 131 mg/dL — ABNORMAL HIGH (ref 70–99)

## 2021-03-15 SURGERY — ESOPHAGOGASTRODUODENOSCOPY (EGD) WITH PROPOFOL
Anesthesia: General | Site: Rectum

## 2021-03-15 MED ORDER — LACTATED RINGERS IV SOLN: INTRAVENOUS | Status: DC

## 2021-03-15 MED ORDER — SODIUM CHLORIDE 0.9 % IV SOLN: INTRAVENOUS | Status: DC

## 2021-03-15 MED ORDER — PROPOFOL 10 MG/ML IV BOLUS
INTRAVENOUS | Status: DC | PRN
  Administered 2021-03-15 (×3): 30 mg via INTRAVENOUS
  Administered 2021-03-15: 40 mg via INTRAVENOUS
  Administered 2021-03-15: 60 mg via INTRAVENOUS
  Administered 2021-03-15 (×5): 30 mg via INTRAVENOUS

## 2021-03-15 MED ORDER — GLYCOPYRROLATE 0.2 MG/ML IJ SOLN
INTRAMUSCULAR | Status: DC | PRN
  Administered 2021-03-15: .1 mg via INTRAVENOUS

## 2021-03-15 MED ORDER — STERILE WATER FOR IRRIGATION IR SOLN
Status: DC | PRN
  Administered 2021-03-15: 120 mL

## 2021-03-15 MED ORDER — LIDOCAINE HCL (CARDIAC) PF 100 MG/5ML IV SOSY
PREFILLED_SYRINGE | INTRAVENOUS | Status: DC | PRN
  Administered 2021-03-15: 80 mg via INTRAVENOUS

## 2021-03-15 MED ORDER — STERILE WATER FOR IRRIGATION IR SOLN
Status: DC | PRN
  Administered 2021-03-15: 250 mL

## 2021-03-15 SURGICAL SUPPLY — 8 items
BLOCK BITE 60FR ADLT L/F GRN (MISCELLANEOUS) ×3 IMPLANT
FORCEPS BIOP RAD 4 LRG CAP 4 (CUTTING FORCEPS) ×1 IMPLANT
GOWN CVR UNV OPN BCK APRN NK (MISCELLANEOUS) ×8 IMPLANT
GOWN ISOL THUMB LOOP REG UNIV (MISCELLANEOUS) ×12
KIT PRC NS LF DISP ENDO (KITS) ×4 IMPLANT
KIT PROCEDURE OLYMPUS (KITS) ×6
MANIFOLD NEPTUNE II (INSTRUMENTS) ×1 IMPLANT
WATER STERILE IRR 250ML POUR (IV SOLUTION) ×6 IMPLANT

## 2021-03-15 NOTE — Op Note (Signed)
[facility] ?Gastroenterology ?Patient Name: Vincent Carlson ?Procedure Date: 03/15/2021 7:18 AM ?MRN: [id number] ?Account #: [id number] ?Date of Birth: [birth year] ?Admit Type: Outpatient ?Age: 46 ?Room: endo 1 ?Gender: Male ?Note Status: Finalized ?Instrument Name: [id number] ?Procedure:             Colonoscopy ?Indications:           Screening for colorectal malignant neoplasm ?Providers:             Wyline Mood MD, MD ?Medicines:             Monitored Anesthesia Care ?Complications:         No immediate complications. ?Procedure:             Pre-Anesthesia Assessment: ?                       - Prior to the procedure, a History and Physical was  ?                       performed, and patient medications, allergies and  ?                       sensitivities were reviewed. The patient's tolerance  ?                       of previous anesthesia was reviewed. ?                       - The risks and benefits of the procedure and the  ?                       sedation options and risks were discussed with the  ?                       patient. All questions were answered and informed  ?                       consent was obtained. ?                       - ASA Grade Assessment: II - A patient with mild  ?                       systemic disease. ?                       After obtaining informed consent, the colonoscope was  ?                       passed under direct vision. Throughout the procedure,  ?                       the patient's blood pressure, pulse, and oxygen  ?                       saturations were monitored continuously. The  ?                       Colonoscope was introduced through the anus and  ?  advanced to the the cecum, identified by the  ?                       appendiceal orifice. The colonoscopy was performed  ?                       with ease. The patient tolerated the procedure well.  ?                       The quality of the bowel preparation was  excellent. ?Findings: ?     The perianal and digital rectal examinations were normal. ?     The entire examined colon appeared normal on direct and retroflexion  ?     views. ?Impression:            - The entire examined colon is normal on direct and  ?                       retroflexion views. ?                       - No specimens collected. ?Recommendation:        - Discharge patient to home (with escort). ?                       - Resume previous diet. ?                       - Continue present medications. ?                       - Repeat colonoscopy in 10 years for screening  ?                       purposes. ?Procedure Code(s):     --- Professional --- ?                       [phone], Colonoscopy, flexible; diagnostic, including  ?                       collection of specimen(s) by brushing or washing, when  ?                       performed (separate procedure) ?Diagnosis Code(s):     --- Professional --- ?                       Z12.11, Encounter for screening for malignant neoplasm  ?                       of colon ?CPT copyright 2019 American Medical Association. All rights reserved. ?The codes documented in this report are preliminary and upon coder review may  ?be revised to meet current compliance requirements. ?Wyline Mood, MD ?Wyline Mood MD, MD ?03/15/2021 7:59:04 AM ?This report has been signed electronically. ?Number of Addenda: 0 ?Note Initiated On: 03/15/2021 7:18 AM ?Scope Withdrawal Time: 0 hours 13 minutes 50 seconds  ?Total Procedure Duration: 0 hours 16 minutes 30 seconds  ?Estimated Blood Loss:  Estimated blood loss: none. ?     [facility] ?

## 2021-03-15 NOTE — Transfer of Care (Signed)
Immediate Anesthesia Transfer of Care Note ? ?Patient: Vincent Carlson ? ?Procedure(s) Performed: ESOPHAGOGASTRODUODENOSCOPY (EGD) WITH PROPOFOL (Esophagus) ?COLONOSCOPY WITH PROPOFOL (Rectum) ? ?Patient Location: PACU ? ?Anesthesia Type: General ? ?Level of Consciousness: awake, alert  and patient cooperative ? ?Airway and Oxygen Therapy: Patient Spontanous Breathing and Patient connected to supplemental oxygen ? ?Post-op Assessment: Post-op Vital signs reviewed, Patient's Cardiovascular Status Stable, Respiratory Function Stable, Patent Airway and No signs of Nausea or vomiting ? ?Post-op Vital Signs: Reviewed and stable ? ?Complications: No notable events documented. ? ?

## 2021-03-15 NOTE — H&P (Signed)
? ? ? ?Jonathon Bellows, MD ?[address] [place], [place], [place], [address] ?[address] [place], [place], [place], [address] ?Phone: [phone]  ?Fax: [phone] ? ?Primary Care Physician:  Maryland Pink, MD ? ? ?Pre-Procedure History & Physical: ?HPI:  Vincent Carlson is a 46 y.o. male is here for an endoscopy and colonoscopy  ?  ?Past Medical History:  ?Diagnosis Date  ? Diabetes mellitus without complication ([place])   ? GERD (gastroesophageal reflux disease)   ? OCC  ? ? ?Past Surgical History:  ?Procedure Laterality Date  ? CIRCUMCISION N/A 10/26/2015  ? Procedure: CIRCUMCISION ADULT;  Surgeon: Nickie Retort, MD;  Location: [facility] ORS;  Service: Urology;  Laterality: N/A;  ? ? ?Prior to Admission medications   ?Medication Sig Start Date End Date Taking? Authorizing Provider  ?acetaminophen (ACETAMINOPHEN 8 HOUR) 650 MG CR tablet Take 1 po q6h as needed for pain 03/03/21  Yes   ?amoxicillin (AMOXIL) 500 MG capsule Take 1 po q6h until finished 03/03/21  Yes   ?atorvastatin (LIPITOR) 20 MG tablet TAKE 1 TABLET (20 MG TOTAL) BY MOUTH ONCE DAILY 03/09/21 03/09/22 Yes   ?dapagliflozin propanediol (FARXIGA) 10 MG TABS tablet TAKE 1 TABLET BY MOUTH EVERY MORNING 11/25/20 11/25/21 Yes   ?glimepiride (AMARYL) 2 MG tablet TAKE TWO TABLETS WITH THE MORNING MEAL AND ONE TABLET WITH SUPPER MEAL. 03/15/20 06/04/21 Yes Solum, Betsey Holiday, MD  ?glimepiride (AMARYL) 2 MG tablet TAKE 1 TABLET BY MOUTH 2 TIMES DAILY 11/25/20 11/25/21 Yes   ?lisinopril (ZESTRIL) 10 MG tablet TAKE 1 TABLET BY MOUTH ONCE DAILY 11/25/20 11/25/21 Yes   ?metFORMIN (GLUCOPHAGE-XR) 500 MG 24 hr tablet TAKE 2 TABLETS BY MOUTH 2 TIMES DAILY 11/25/20 11/25/21 Yes   ?tirzepatide (MOUNJARO) 12.5 MG/0.5ML Pen Inject 12.5 mg subcutaneously every 7 (seven) days 11/28/20  Yes   ?Continuous Blood Gluc Receiver (FREESTYLE LIBRE 14 DAY READER) DEVI Use 1 each as directed 04/10/17   [provider]  ?Continuous Blood Gluc Sensor (FREESTYLE LIBRE 14 DAY SENSOR) MISC  USE 1 KIT EVERY 14 (FOURTEEN) DAYS 11/25/20     ?Continuous Blood Gluc Sensor (FREESTYLE LIBRE SENSOR SYSTEM) MISC USE 1 EACH EVERY 14 (FOURTEEN) DAYS 01/31/18   [provider]  ?COVID-19 At Home Antigen Test [facility] COVID-19 HOME TEST) KIT use as directed within package instructions 09/01/20   Letta Median, [facility]  ?COVID-19 mRNA bivalent vaccine, Pfizer, (PFIZER COVID-19 VAC BIVALENT) injection Inject into the muscle. 11/10/20   Carlyle Basques, MD  ?ibuprofen (ADVIL) 600 MG tablet Take 1 po q6h as needed for pain ?Patient not taking: Reported on 03/08/2021 02/14/21     ?methylPREDNISolone (MEDROL) 4 MG tablet Use as directed. 1 Medrol Dosepak. ?Patient not taking: Reported on 03/08/2021 03/03/21     ?nicotine (NICODERM CQ - DOSED IN MG/24 HOURS) 14 mg/24hr patch Place 1 patch onto the skin once a day. 02/23/21   Letta Median, RPH  ?Semaglutide, 1 MG/DOSE, (OZEMPIC, 1 MG/DOSE,) 4 MG/3ML SOPN INJECT 1MG UNDER THE SKIN EVERY 7 DAYS ?Patient not taking: Reported on 03/08/2021 06/09/20 06/09/21    ? ? ?Allergies as of 02/15/2021  ? (No Known Allergies)  ? ? ?Family History  ?Problem Relation Age of Onset  ? Prostate cancer Neg Hx   ? Kidney cancer Neg Hx   ? ? ?Social History  ? ?Socioeconomic History  ? Marital status: Married  ?  Spouse name: Not on file  ? Number of children: Not on file  ? Years of  education: Not on file  ? Highest education level: Not on file  ?Occupational History  ? Not on file  ?Tobacco Use  ? Smoking status: Former  ?  Packs/day: 1.00  ?  Years: 20.00  ?  Pack years: 20.00  ?  Types: Cigarettes  ?  Quit date: 07/28/2013  ?  Years since quitting: 7.6  ? Smokeless tobacco: Never  ?Vaping Use  ? Vaping Use: Never used  ?Substance and Sexual Activity  ? Alcohol use: Yes  ?  Alcohol/week: 2.0 standard drinks  ?  Types: 2 Standard drinks or equivalent per week  ?  Comment: OCC 1-2 drinks/month  ? Drug use: No  ? Sexual activity: Not on file  ?Other Topics Concern  ? Not on file  ?Social History  Narrative  ? Not on file  ? ?Social Determinants of Health  ? ?Financial Resource Strain: Not on file  ?Food Insecurity: Not on file  ?Transportation Needs: Not on file  ?Physical Activity: Not on file  ?Stress: Not on file  ?Social Connections: Not on file  ?Intimate Partner Violence: Not on file  ? ? ?Review of Systems: ?See HPI, otherwise negative ROS ? ?Physical Exam: ?BP 132/89   Pulse 91   Temp (!) 97.3 ?F (36.3 ?C) (Temporal)   Resp 20   Ht '5\' 11"'  (1.803 m)   Wt 87.1 kg   SpO2 99%   BMI 26.78 kg/m?  ?General:   Alert,  pleasant and cooperative in NAD ?Head:  Normocephalic and atraumatic. ?Neck:  Supple; no masses or thyromegaly. ?Lungs:  Clear throughout to auscultation, normal respiratory effort.    ?Heart:  +S1, +S2, Regular rate and rhythm, No edema. ?Abdomen:  Soft, nontender and nondistended. Normal bowel sounds, without guarding, and without rebound.   ?Neurologic:  Alert and  oriented x4;  grossly normal neurologically. ? ?Impression/Plan: ?Vincent Carlson is here for an endoscopy and colonoscopy  to be performed for  evaluation of GERD and colon cancer screening  ?   ?Risks, benefits, limitations, and alternatives regarding endoscopy have been reviewed with the patient.  Questions have been answered.  All parties agreeable. ? ? ?Jonathon Bellows, MD  03/15/2021, 7:23 AM ? ?

## 2021-03-15 NOTE — Op Note (Signed)
[facility] ?Gastroenterology ?Patient Name: Vincent Carlson ?Procedure Date: 03/15/2021 7:21 AM ?MRN: [id number] ?Account #: [id number] ?Date of Birth: 1975-08-09 ?Admit Type: Outpatient ?Age: 46 ?Room: [facility] OR ROOM 01 ?Gender: Male ?Note Status: Finalized ?Instrument Name: [id number] ?Procedure:             Upper GI endoscopy ?Indications:           Follow-up of esophageal reflux ?Providers:             Wyline Mood MD, MD ?Referring MD:          Rhona Leavens. Burnett Sheng, MD (Referring MD) ?Medicines:             Monitored Anesthesia Care ?Complications:         No immediate complications. ?Procedure:             Pre-Anesthesia Assessment: ?                       - Prior to the procedure, a History and Physical was  ?                       performed, and patient medications, allergies and  ?                       sensitivities were reviewed. The patient's tolerance  ?                       of previous anesthesia was reviewed. ?                       - The risks and benefits of the procedure and the  ?                       sedation options and risks were discussed with the  ?                       patient. All questions were answered and informed  ?                       consent was obtained. ?                       - ASA Grade Assessment: II - A patient with mild  ?                       systemic disease. ?                       After obtaining informed consent, the endoscope was  ?                       passed under direct vision. Throughout the procedure,  ?                       the patient's blood pressure, pulse, and oxygen  ?                       saturations were monitored continuously. The Endoscope  ?                       was  introduced through the mouth, and advanced to the  ?                       third part of duodenum. The upper GI endoscopy was  ?                       accomplished with ease. The patient tolerated the  ?                       procedure well. ?Findings: ?     The stomach was  normal. ?     The cardia and gastric fundus were normal on retroflexion. ?     The esophagus was normal. ?     Localized nodular mucosa was found in the first portion of the duodenum.  ?     Biopsies were taken with a cold forceps for histology. ?     Localized mild inflammation characterized by congestion (edema) and  ?     erythema was found in the duodenal bulb. Biopsies were taken with a cold  ?     forceps for histology. ?     The exam was otherwise without abnormality. ?Impression:            - Normal stomach. ?                       - Normal esophagus. ?                       - Nodular mucosa in the first portion of the duodenum.  ?                       Biopsied. ?                       - Duodenitis. Biopsied. ?                       - The examination was otherwise normal. ?Recommendation:        - Await pathology results. ?                       - Perform a colonoscopy today. ?Procedure Code(s):     --- Professional --- ?                       [phone], Esophagogastroduodenoscopy, flexible,  ?                       transoral; with biopsy, single or multiple ?Diagnosis Code(s):     --- Professional --- ?                       K31.89, Other diseases of stomach and duodenum ?                       K29.80, Duodenitis without bleeding ?                       K21.9, Gastro-esophageal reflux disease without  ?                       esophagitis ?CPT copyright 2019 American Medical Association.  All rights reserved. ?The codes documented in this report are preliminary and upon coder review may  ?be revised to meet current compliance requirements. ?Wyline Mood, MD ?Wyline Mood MD, MD ?03/15/2021 7:40:03 AM ?This report has been signed electronically. ?Number of Addenda: 0 ?Note Initiated On: 03/15/2021 7:21 AM ?Total Procedure Duration: 0 hours 3 minutes 50 seconds  ?Estimated Blood Loss:  Estimated blood loss: none. ?     [facility] ?

## 2021-03-15 NOTE — Anesthesia Postprocedure Evaluation (Signed)
Anesthesia Post Note ? ?Patient: Vincent Carlson ? ?Procedure(s) Performed: ESOPHAGOGASTRODUODENOSCOPY (EGD) WITH PROPOFOL (Esophagus) ?COLONOSCOPY WITH PROPOFOL (Rectum) ? ? ?  ?Patient location during evaluation: PACU ?Anesthesia Type: General ?Level of consciousness: awake and alert ?Pain management: pain level controlled ?Vital Signs Assessment: post-procedure vital signs reviewed and stable ?Respiratory status: spontaneous breathing, nonlabored ventilation, respiratory function stable and patient connected to nasal cannula oxygen ?Cardiovascular status: blood pressure returned to baseline and stable ?Postop Assessment: no apparent nausea or vomiting ?Anesthetic complications: no ? ? ?No notable events documented. ? ?Orrin Brigham ? ? ? ? ? ?

## 2021-03-15 NOTE — Anesthesia Preprocedure Evaluation (Signed)
Anesthesia Evaluation  ?Patient identified by MRN, date of birth, ID band ?Patient awake ? ? ? ?Reviewed: ?NPO status  ? ?History of Anesthesia Complications ?Negative for: history of anesthetic complications ? ?Airway ?Mallampati: II ? ?TM Distance: >3 FB ?Neck ROM: full ? ? ? Dental ?no notable dental hx. ? ?  ?Pulmonary ?neg pulmonary ROS, former smoker,  ?  ?Pulmonary exam normal ? ? ? ? ? ? ? Cardiovascular ?Exercise Tolerance: Good ?hypertension, Normal cardiovascular exam ? ? ?  ?Neuro/Psych ?negative neurological ROS ? negative psych ROS  ? GI/Hepatic ?Neg liver ROS, Controlled,  ?Endo/Other  ?diabetes ? Renal/GU ?negative Renal ROS  ?negative genitourinary ?  ?Musculoskeletal ? ? Abdominal ?  ?Peds ? Hematology ?negative hematology ROS ?(+)   ?Anesthesia Other Findings ? ? Reproductive/Obstetrics ? ?  ? ? ? ? ? ? ? ? ? ? ? ? ? ?  ?  ? ? ? ? ? ? ? ? ?Anesthesia Physical ?Anesthesia Plan ? ?ASA: 2 ? ?Anesthesia Plan: General  ? ?Post-op Pain Management:   ? ?Induction:  ? ?PONV Risk Score and Plan: TIVA and Propofol infusion ? ?Airway Management Planned:  ? ?Additional Equipment:  ? ?Intra-op Plan:  ? ?Post-operative Plan:  ? ?Informed Consent: I have reviewed the patients History and Physical, chart, labs and discussed the procedure including the risks, benefits and alternatives for the proposed anesthesia with the patient or authorized representative who has indicated his/her understanding and acceptance.  ? ? ? ? ? ?Plan Discussed with: CRNA ? ?Anesthesia Plan Comments:   ? ? ? ? ? ? ?Anesthesia Quick Evaluation ? ?

## 2021-03-15 NOTE — Anesthesia Procedure Notes (Signed)
Date/Time: 03/15/2021 7:30 AM ?Performed by: Lily Kocher, CRNA ?Pre-anesthesia Checklist: Patient identified, Emergency Drugs available, Suction available, Patient being monitored and Timeout performed ?Patient Re-evaluated:Patient Re-evaluated prior to induction ?Oxygen Delivery Method: Nasal cannula ?Induction Type: IV induction ?Placement Confirmation: positive ETCO2 ? ? ? ? ?

## 2021-03-16 ENCOUNTER — Other Ambulatory Visit: Payer: Self-pay

## 2021-03-16 ENCOUNTER — Encounter: Payer: Self-pay | Admitting: Gastroenterology

## 2021-03-16 LAB — SURGICAL PATHOLOGY

## 2021-03-17 ENCOUNTER — Other Ambulatory Visit: Payer: Self-pay

## 2021-03-17 ENCOUNTER — Encounter: Payer: Self-pay | Admitting: Gastroenterology

## 2021-03-20 ENCOUNTER — Other Ambulatory Visit: Payer: Self-pay

## 2021-03-20 MED ORDER — GLIMEPIRIDE 2 MG PO TABS
2.0000 mg | ORAL_TABLET | Freq: Two times a day (BID) | ORAL | 3 refills | Status: AC
  Filled 2021-03-20 – 2021-05-29 (×3): qty 180, 90d supply, fill #0
  Filled 2021-08-14: qty 180, 90d supply, fill #1
  Filled 2021-11-27: qty 180, 90d supply, fill #2

## 2021-03-20 MED ORDER — MOUNJARO 12.5 MG/0.5ML ~~LOC~~ SOAJ
SUBCUTANEOUS | 3 refills | Status: DC
  Filled 2021-03-20 – 2021-04-03 (×2): qty 6, 84d supply, fill #0
  Filled 2021-06-08: qty 2, 28d supply, fill #1
  Filled 2021-07-06: qty 2, 28d supply, fill #2
  Filled 2021-08-14: qty 2, 28d supply, fill #3
  Filled 2021-09-13: qty 2, 28d supply, fill #4
  Filled 2021-09-28: qty 2, 28d supply, fill #5
  Filled 2021-11-07: qty 2, 28d supply, fill #6
  Filled 2021-11-27: qty 2, 28d supply, fill #7
  Filled 2021-12-29: qty 2, 28d supply, fill #8
  Filled 2022-01-29: qty 2, 28d supply, fill #9

## 2021-03-23 ENCOUNTER — Other Ambulatory Visit: Payer: Self-pay

## 2021-03-24 ENCOUNTER — Other Ambulatory Visit: Payer: Self-pay

## 2021-03-24 ENCOUNTER — Other Ambulatory Visit: Payer: Self-pay | Admitting: Pharmacist

## 2021-03-24 MED ORDER — NICOTINE 14 MG/24HR TD PT24
MEDICATED_PATCH | TRANSDERMAL | 0 refills | Status: DC
  Filled 2021-03-24: qty 28, 28d supply, fill #0

## 2021-03-27 ENCOUNTER — Other Ambulatory Visit: Payer: Self-pay

## 2021-03-27 MED ORDER — HYDROCODONE-ACETAMINOPHEN 5-325 MG PO TABS
1.0000 | ORAL_TABLET | Freq: Four times a day (QID) | ORAL | 0 refills | Status: AC | PRN
  Filled 2021-03-27: qty 12, 5d supply, fill #0

## 2021-03-27 MED ORDER — CHLORHEXIDINE GLUCONATE 0.12 % MT SOLN
OROMUCOSAL | 0 refills | Status: AC
  Filled 2021-03-27: qty 473, 15d supply, fill #0

## 2021-04-03 ENCOUNTER — Other Ambulatory Visit: Payer: Self-pay

## 2021-04-04 ENCOUNTER — Other Ambulatory Visit: Payer: Self-pay

## 2021-04-10 ENCOUNTER — Other Ambulatory Visit: Payer: Self-pay

## 2021-04-12 ENCOUNTER — Telehealth: Payer: Self-pay

## 2021-04-12 NOTE — Telephone Encounter (Signed)
Faxed Colonoscopy, egd report with Path report to [facility] per there request. Faxed to [phone] ?

## 2021-04-25 ENCOUNTER — Other Ambulatory Visit: Payer: Self-pay

## 2021-04-25 ENCOUNTER — Other Ambulatory Visit: Payer: Self-pay | Admitting: Pharmacist

## 2021-04-25 MED ORDER — NICOTINE 14 MG/24HR TD PT24
MEDICATED_PATCH | TRANSDERMAL | 0 refills | Status: DC
  Filled 2021-04-25: qty 28, 28d supply, fill #0

## 2021-04-27 ENCOUNTER — Other Ambulatory Visit: Payer: Self-pay

## 2021-04-27 MED ORDER — MELOXICAM 7.5 MG PO TABS
ORAL_TABLET | ORAL | 3 refills | Status: AC
  Filled 2021-04-27: qty 60, 30d supply, fill #0
  Filled 2021-07-06: qty 60, 30d supply, fill #1

## 2021-05-15 ENCOUNTER — Other Ambulatory Visit: Payer: Self-pay

## 2021-05-16 ENCOUNTER — Other Ambulatory Visit: Payer: Self-pay

## 2021-05-16 MED ORDER — FREESTYLE LIBRE 14 DAY SENSOR MISC
11 refills | Status: AC
  Filled 2021-05-16: qty 2, 28d supply, fill #0
  Filled 2021-07-13: qty 6, 84d supply, fill #1
  Filled 2021-09-28: qty 6, 84d supply, fill #2
  Filled 2021-12-29: qty 6, 84d supply, fill #3
  Filled 2022-03-23: qty 4, 56d supply, fill #4

## 2021-05-17 ENCOUNTER — Other Ambulatory Visit: Payer: Self-pay

## 2021-05-17 MED ORDER — CYCLOBENZAPRINE HCL 5 MG PO TABS
ORAL_TABLET | ORAL | 0 refills | Status: DC
  Filled 2021-05-17: qty 30, 30d supply, fill #0

## 2021-05-17 MED ORDER — MELOXICAM 15 MG PO TABS
ORAL_TABLET | ORAL | 0 refills | Status: DC
  Filled 2021-05-17: qty 30, 30d supply, fill #0

## 2021-05-29 ENCOUNTER — Other Ambulatory Visit: Payer: Self-pay

## 2021-06-08 ENCOUNTER — Other Ambulatory Visit: Payer: Self-pay

## 2021-06-09 ENCOUNTER — Other Ambulatory Visit: Payer: Self-pay

## 2021-06-09 MED ORDER — NICOTINE 14 MG/24HR TD PT24
MEDICATED_PATCH | TRANSDERMAL | 0 refills | Status: DC
  Filled 2021-06-09: qty 28, 28d supply, fill #0

## 2021-07-05 ENCOUNTER — Other Ambulatory Visit (HOSPITAL_COMMUNITY): Payer: Self-pay

## 2021-07-06 ENCOUNTER — Other Ambulatory Visit: Payer: Self-pay

## 2021-07-07 ENCOUNTER — Other Ambulatory Visit: Payer: Self-pay

## 2021-07-10 ENCOUNTER — Other Ambulatory Visit: Payer: Self-pay | Admitting: Pharmacist

## 2021-07-10 ENCOUNTER — Other Ambulatory Visit: Payer: Self-pay

## 2021-07-10 MED ORDER — NICOTINE 14 MG/24HR TD PT24
MEDICATED_PATCH | TRANSDERMAL | 0 refills | Status: DC
Start: 2021-07-10 — End: 2021-08-29
  Filled 2021-07-10: qty 28, 28d supply, fill #0

## 2021-07-13 ENCOUNTER — Other Ambulatory Visit: Payer: Self-pay

## 2021-07-14 ENCOUNTER — Other Ambulatory Visit: Payer: Self-pay

## 2021-07-26 ENCOUNTER — Other Ambulatory Visit: Payer: Self-pay

## 2021-07-26 MED ORDER — HYDROXYZINE HCL 25 MG PO TABS
ORAL_TABLET | ORAL | 0 refills | Status: DC
  Filled 2021-07-26: qty 20, 7d supply, fill #0

## 2021-07-26 MED ORDER — DOXYCYCLINE HYCLATE 100 MG PO CAPS
ORAL_CAPSULE | ORAL | 0 refills | Status: AC
  Filled 2021-07-26: qty 20, 10d supply, fill #0

## 2021-08-14 ENCOUNTER — Other Ambulatory Visit: Payer: Self-pay

## 2021-08-29 ENCOUNTER — Other Ambulatory Visit: Payer: Self-pay

## 2021-08-29 ENCOUNTER — Other Ambulatory Visit: Payer: Self-pay | Admitting: Pharmacist

## 2021-08-29 MED ORDER — NICOTINE 14 MG/24HR TD PT24
MEDICATED_PATCH | TRANSDERMAL | 0 refills | Status: DC
  Filled 2021-08-29: qty 28, 28d supply, fill #0

## 2021-09-13 ENCOUNTER — Other Ambulatory Visit: Payer: Self-pay

## 2021-09-28 ENCOUNTER — Other Ambulatory Visit: Payer: Self-pay

## 2021-09-29 ENCOUNTER — Other Ambulatory Visit: Payer: Self-pay

## 2021-09-29 MED ORDER — LISINOPRIL 10 MG PO TABS
ORAL_TABLET | Freq: Every day | ORAL | 3 refills | Status: DC
  Filled 2021-09-29: qty 90, 90d supply, fill #0
  Filled 2021-12-29: qty 90, 90d supply, fill #1
  Filled 2022-04-02: qty 90, 90d supply, fill #2
  Filled 2022-06-26: qty 90, 90d supply, fill #3

## 2021-09-29 MED ORDER — METFORMIN HCL ER 500 MG PO TB24
ORAL_TABLET | Freq: Two times a day (BID) | ORAL | 3 refills | Status: DC
  Filled 2021-09-29: qty 360, 90d supply, fill #0
  Filled 2021-12-29: qty 360, 90d supply, fill #1
  Filled 2022-04-02: qty 360, 90d supply, fill #2
  Filled 2022-06-26: qty 360, 90d supply, fill #3

## 2021-09-29 MED ORDER — DAPAGLIFLOZIN PROPANEDIOL 10 MG PO TABS
ORAL_TABLET | Freq: Every morning | ORAL | 3 refills | Status: DC
  Filled 2021-09-29: qty 90, 90d supply, fill #0
  Filled 2021-12-29: qty 90, 90d supply, fill #1
  Filled 2022-04-02: qty 90, 90d supply, fill #2
  Filled 2022-06-26: qty 90, 90d supply, fill #3

## 2021-10-02 ENCOUNTER — Other Ambulatory Visit: Payer: Self-pay

## 2021-10-02 ENCOUNTER — Other Ambulatory Visit: Payer: Self-pay | Admitting: Pharmacist

## 2021-10-02 MED ORDER — NICOTINE 14 MG/24HR TD PT24
MEDICATED_PATCH | TRANSDERMAL | 0 refills | Status: DC
  Filled 2021-10-02: qty 28, 28d supply, fill #0

## 2021-10-06 ENCOUNTER — Other Ambulatory Visit: Payer: Self-pay

## 2021-11-07 ENCOUNTER — Other Ambulatory Visit: Payer: Self-pay | Admitting: Pharmacist

## 2021-11-07 ENCOUNTER — Other Ambulatory Visit: Payer: Self-pay

## 2021-11-07 MED ORDER — NICOTINE 14 MG/24HR TD PT24
MEDICATED_PATCH | TRANSDERMAL | 0 refills | Status: DC
  Filled 2021-11-07: qty 28, 28d supply, fill #0

## 2021-11-10 ENCOUNTER — Other Ambulatory Visit: Payer: Self-pay

## 2021-11-10 MED ORDER — COVID-19 MRNA VAC-TRIS(PFIZER) 30 MCG/0.3ML IM SUSY
PREFILLED_SYRINGE | INTRAMUSCULAR | 0 refills | Status: AC
  Filled 2021-11-10: qty 0.3, 1d supply, fill #0

## 2021-11-27 ENCOUNTER — Other Ambulatory Visit: Payer: Self-pay

## 2021-11-28 ENCOUNTER — Other Ambulatory Visit: Payer: Self-pay

## 2021-12-01 ENCOUNTER — Other Ambulatory Visit: Payer: Self-pay

## 2021-12-06 ENCOUNTER — Other Ambulatory Visit: Payer: Self-pay

## 2021-12-08 ENCOUNTER — Other Ambulatory Visit: Payer: Self-pay

## 2021-12-08 MED ORDER — NICOTINE 14 MG/24HR TD PT24
MEDICATED_PATCH | TRANSDERMAL | 0 refills | Status: AC
  Filled 2021-12-08 – 2022-01-16 (×5): qty 28, 28d supply, fill #0

## 2021-12-10 ENCOUNTER — Other Ambulatory Visit: Payer: Self-pay

## 2021-12-11 ENCOUNTER — Other Ambulatory Visit: Payer: Self-pay

## 2021-12-13 ENCOUNTER — Other Ambulatory Visit: Payer: Self-pay

## 2021-12-29 ENCOUNTER — Other Ambulatory Visit: Payer: Self-pay

## 2022-01-02 ENCOUNTER — Other Ambulatory Visit: Payer: Self-pay

## 2022-01-15 ENCOUNTER — Other Ambulatory Visit: Payer: Self-pay

## 2022-01-16 ENCOUNTER — Other Ambulatory Visit: Payer: Self-pay

## 2022-01-29 ENCOUNTER — Other Ambulatory Visit: Payer: Self-pay

## 2022-01-29 MED ORDER — NICOTINE 7 MG/24HR TD PT24
MEDICATED_PATCH | TRANSDERMAL | 0 refills | Status: DC
  Filled 2022-01-29: qty 28, 28d supply, fill #0

## 2022-02-23 ENCOUNTER — Other Ambulatory Visit: Payer: Self-pay | Admitting: Pharmacist

## 2022-02-23 ENCOUNTER — Other Ambulatory Visit: Payer: Self-pay

## 2022-02-23 DIAGNOSIS — Z0000 Encounter for general adult medical examination without abnormal findings: Secondary | ICD-10-CM | POA: Diagnosis not present

## 2022-02-23 DIAGNOSIS — E119 Type 2 diabetes mellitus without complications: Secondary | ICD-10-CM | POA: Diagnosis not present

## 2022-02-23 DIAGNOSIS — Z125 Encounter for screening for malignant neoplasm of prostate: Secondary | ICD-10-CM | POA: Diagnosis not present

## 2022-02-23 MED ORDER — TIRZEPATIDE 12.5 MG/0.5ML ~~LOC~~ SOAJ
12.5000 mg | SUBCUTANEOUS | 1 refills | Status: DC
  Filled 2022-02-23: qty 2, 28d supply, fill #0
  Filled 2022-03-27: qty 2, 28d supply, fill #1
  Filled 2022-04-24: qty 2, 28d supply, fill #2
  Filled 2022-05-15 – 2022-05-17 (×2): qty 2, 28d supply, fill #3
  Filled 2022-06-26: qty 2, 28d supply, fill #4
  Filled 2022-07-17 – 2022-07-19 (×2): qty 2, 28d supply, fill #5

## 2022-02-27 ENCOUNTER — Other Ambulatory Visit: Payer: Self-pay

## 2022-03-01 ENCOUNTER — Other Ambulatory Visit: Payer: Self-pay

## 2022-03-01 DIAGNOSIS — E119 Type 2 diabetes mellitus without complications: Secondary | ICD-10-CM | POA: Diagnosis not present

## 2022-03-01 MED ORDER — DEXCOM G7 SENSOR MISC
3 refills | Status: DC
  Filled 2022-03-01 – 2022-03-02 (×2): qty 9, 90d supply, fill #0

## 2022-03-01 MED ORDER — GLIMEPIRIDE 2 MG PO TABS
2.0000 mg | ORAL_TABLET | Freq: Every day | ORAL | 3 refills | Status: DC
  Filled 2022-03-01: qty 90, 90d supply, fill #0
  Filled 2022-06-01: qty 90, 90d supply, fill #1
  Filled 2022-09-11: qty 90, 90d supply, fill #2
  Filled 2022-12-24: qty 90, 90d supply, fill #3

## 2022-03-02 ENCOUNTER — Other Ambulatory Visit: Payer: Self-pay

## 2022-03-02 MED ORDER — NICOTINE 7 MG/24HR TD PT24
MEDICATED_PATCH | TRANSDERMAL | 0 refills | Status: DC
  Filled 2022-03-02: qty 28, 28d supply, fill #0

## 2022-03-05 ENCOUNTER — Other Ambulatory Visit: Payer: Self-pay

## 2022-03-23 ENCOUNTER — Other Ambulatory Visit: Payer: Self-pay

## 2022-03-26 ENCOUNTER — Other Ambulatory Visit: Payer: Self-pay

## 2022-03-27 ENCOUNTER — Other Ambulatory Visit: Payer: Self-pay

## 2022-04-02 ENCOUNTER — Other Ambulatory Visit: Payer: Self-pay

## 2022-04-02 MED ORDER — ATORVASTATIN CALCIUM 20 MG PO TABS
20.0000 mg | ORAL_TABLET | Freq: Every day | ORAL | 3 refills | Status: DC
  Filled 2022-04-02: qty 90, 90d supply, fill #0
  Filled 2022-06-26: qty 90, 90d supply, fill #1
  Filled 2022-09-28: qty 90, 90d supply, fill #2
  Filled 2022-12-24: qty 90, 90d supply, fill #3

## 2022-04-04 ENCOUNTER — Other Ambulatory Visit: Payer: Self-pay

## 2022-04-24 ENCOUNTER — Other Ambulatory Visit: Payer: Self-pay

## 2022-04-24 ENCOUNTER — Other Ambulatory Visit: Payer: Self-pay | Admitting: Pharmacist

## 2022-04-24 MED ORDER — NICOTINE 7 MG/24HR TD PT24
MEDICATED_PATCH | TRANSDERMAL | 0 refills | Status: DC
Start: 2022-04-24 — End: 2022-06-05
  Filled 2022-04-24: qty 28, 28d supply, fill #0

## 2022-05-15 ENCOUNTER — Other Ambulatory Visit: Payer: Self-pay

## 2022-05-18 ENCOUNTER — Other Ambulatory Visit: Payer: Self-pay

## 2022-05-18 MED ORDER — FREESTYLE LIBRE 2 SENSOR MISC
1.0000 | 6 refills | Status: DC
  Filled 2022-05-18: qty 2, 28d supply, fill #0
  Filled 2022-06-26: qty 6, 84d supply, fill #1
  Filled 2022-09-11 – 2023-02-04 (×2): qty 6, 84d supply, fill #2

## 2022-06-01 ENCOUNTER — Other Ambulatory Visit: Payer: Self-pay

## 2022-06-01 ENCOUNTER — Other Ambulatory Visit: Payer: Self-pay | Admitting: Pharmacist

## 2022-06-04 ENCOUNTER — Other Ambulatory Visit: Payer: Self-pay

## 2022-06-05 ENCOUNTER — Other Ambulatory Visit: Payer: Self-pay

## 2022-06-05 MED ORDER — NICOTINE 7 MG/24HR TD PT24
MEDICATED_PATCH | TRANSDERMAL | 0 refills | Status: DC
  Filled 2022-06-05: qty 28, 28d supply, fill #0

## 2022-06-26 ENCOUNTER — Other Ambulatory Visit: Payer: Self-pay

## 2022-07-05 ENCOUNTER — Other Ambulatory Visit: Payer: Self-pay

## 2022-07-05 MED ORDER — CYCLOBENZAPRINE HCL 5 MG PO TABS
ORAL_TABLET | ORAL | 0 refills | Status: AC
  Filled 2022-07-05: qty 30, 30d supply, fill #0

## 2022-07-05 MED ORDER — MELOXICAM 15 MG PO TABS
ORAL_TABLET | ORAL | 0 refills | Status: AC
  Filled 2022-07-05: qty 30, 30d supply, fill #0

## 2022-07-17 ENCOUNTER — Other Ambulatory Visit: Payer: Self-pay

## 2022-07-19 ENCOUNTER — Other Ambulatory Visit: Payer: Self-pay

## 2022-07-20 ENCOUNTER — Other Ambulatory Visit: Payer: Self-pay

## 2022-07-20 MED ORDER — MOUNJARO 12.5 MG/0.5ML ~~LOC~~ SOAJ
12.5000 mg | SUBCUTANEOUS | 1 refills | Status: DC
  Filled 2022-07-20: qty 6, 84d supply, fill #0
  Filled 2022-09-28: qty 6, 84d supply, fill #1

## 2022-07-23 ENCOUNTER — Other Ambulatory Visit: Payer: Self-pay

## 2022-08-15 DIAGNOSIS — E119 Type 2 diabetes mellitus without complications: Secondary | ICD-10-CM | POA: Diagnosis not present

## 2022-08-15 DIAGNOSIS — H524 Presbyopia: Secondary | ICD-10-CM | POA: Diagnosis not present

## 2022-08-23 DIAGNOSIS — E119 Type 2 diabetes mellitus without complications: Secondary | ICD-10-CM | POA: Diagnosis not present

## 2022-08-30 ENCOUNTER — Other Ambulatory Visit: Payer: Self-pay

## 2022-08-30 DIAGNOSIS — E119 Type 2 diabetes mellitus without complications: Secondary | ICD-10-CM | POA: Diagnosis not present

## 2022-08-30 MED ORDER — DAPAGLIFLOZIN PROPANEDIOL 10 MG PO TABS
10.0000 mg | ORAL_TABLET | Freq: Every morning | ORAL | 3 refills | Status: DC
  Filled 2022-08-30 – 2022-09-28 (×2): qty 90, 90d supply, fill #0
  Filled 2022-12-24: qty 90, 90d supply, fill #1
  Filled 2023-03-18: qty 90, 90d supply, fill #2
  Filled 2023-06-20: qty 90, 90d supply, fill #3

## 2022-09-11 ENCOUNTER — Other Ambulatory Visit: Payer: Self-pay

## 2022-09-12 ENCOUNTER — Other Ambulatory Visit: Payer: Self-pay

## 2022-09-14 ENCOUNTER — Other Ambulatory Visit: Payer: Self-pay

## 2022-09-18 ENCOUNTER — Other Ambulatory Visit: Payer: Self-pay

## 2022-09-19 ENCOUNTER — Other Ambulatory Visit: Payer: Self-pay

## 2022-09-19 MED ORDER — FREESTYLE LIBRE 3 PLUS SENSOR MISC
1.0000 | 3 refills | Status: DC
  Filled 2022-09-19: qty 6, 84d supply, fill #0

## 2022-09-28 ENCOUNTER — Other Ambulatory Visit: Payer: Self-pay

## 2022-09-28 MED ORDER — METFORMIN HCL ER 500 MG PO TB24
1000.0000 mg | ORAL_TABLET | Freq: Two times a day (BID) | ORAL | 3 refills | Status: DC
  Filled 2022-09-28: qty 360, 90d supply, fill #0
  Filled 2022-12-24: qty 360, 90d supply, fill #1
  Filled 2023-03-18: qty 360, 90d supply, fill #2
  Filled 2023-06-20: qty 360, 90d supply, fill #3

## 2022-09-28 MED ORDER — LISINOPRIL 10 MG PO TABS
10.0000 mg | ORAL_TABLET | Freq: Every day | ORAL | 3 refills | Status: DC
  Filled 2022-09-28: qty 90, 90d supply, fill #0
  Filled 2022-12-24: qty 90, 90d supply, fill #1
  Filled 2023-03-18: qty 90, 90d supply, fill #2
  Filled 2023-06-20: qty 90, 90d supply, fill #3

## 2022-10-17 ENCOUNTER — Other Ambulatory Visit (HOSPITAL_COMMUNITY): Payer: Self-pay

## 2022-11-06 DIAGNOSIS — Z0000 Encounter for general adult medical examination without abnormal findings: Secondary | ICD-10-CM | POA: Diagnosis not present

## 2022-11-06 DIAGNOSIS — G4700 Insomnia, unspecified: Secondary | ICD-10-CM | POA: Diagnosis not present

## 2022-11-06 DIAGNOSIS — I10 Essential (primary) hypertension: Secondary | ICD-10-CM | POA: Diagnosis not present

## 2022-11-06 DIAGNOSIS — E119 Type 2 diabetes mellitus without complications: Secondary | ICD-10-CM | POA: Diagnosis not present

## 2022-11-12 ENCOUNTER — Other Ambulatory Visit: Payer: Self-pay

## 2022-11-12 MED ORDER — COMIRNATY 30 MCG/0.3ML IM SUSY
0.3000 mL | PREFILLED_SYRINGE | Freq: Once | INTRAMUSCULAR | 0 refills | Status: AC
  Filled 2022-11-12: qty 0.3, 1d supply, fill #0

## 2022-11-15 ENCOUNTER — Other Ambulatory Visit: Payer: Self-pay

## 2022-11-20 ENCOUNTER — Other Ambulatory Visit: Payer: Self-pay

## 2022-11-21 ENCOUNTER — Other Ambulatory Visit: Payer: Self-pay

## 2022-11-21 MED ORDER — FREESTYLE LIBRE 3 SENSOR MISC
1 refills | Status: DC
  Filled 2022-11-21: qty 6, 84d supply, fill #0
  Filled 2022-11-22: qty 2, 28d supply, fill #0
  Filled 2022-11-26: qty 6, 84d supply, fill #0
  Filled 2023-02-04: qty 6, 84d supply, fill #1

## 2022-11-22 ENCOUNTER — Other Ambulatory Visit: Payer: Self-pay

## 2022-11-26 ENCOUNTER — Other Ambulatory Visit: Payer: Self-pay

## 2022-11-27 ENCOUNTER — Other Ambulatory Visit: Payer: Self-pay

## 2022-12-24 ENCOUNTER — Other Ambulatory Visit: Payer: Self-pay

## 2022-12-24 MED ORDER — TIRZEPATIDE 12.5 MG/0.5ML ~~LOC~~ SOAJ
12.5000 mg | SUBCUTANEOUS | 1 refills | Status: DC
  Filled 2022-12-24: qty 6, 84d supply, fill #0
  Filled 2023-03-18: qty 6, 84d supply, fill #1

## 2022-12-26 ENCOUNTER — Other Ambulatory Visit: Payer: Self-pay

## 2023-02-04 ENCOUNTER — Other Ambulatory Visit: Payer: Self-pay

## 2023-03-01 DIAGNOSIS — Z125 Encounter for screening for malignant neoplasm of prostate: Secondary | ICD-10-CM | POA: Diagnosis not present

## 2023-03-01 DIAGNOSIS — E119 Type 2 diabetes mellitus without complications: Secondary | ICD-10-CM | POA: Diagnosis not present

## 2023-03-01 DIAGNOSIS — Z0000 Encounter for general adult medical examination without abnormal findings: Secondary | ICD-10-CM | POA: Diagnosis not present

## 2023-03-04 ENCOUNTER — Other Ambulatory Visit: Payer: Self-pay

## 2023-03-07 DIAGNOSIS — E119 Type 2 diabetes mellitus without complications: Secondary | ICD-10-CM | POA: Diagnosis not present

## 2023-03-18 ENCOUNTER — Other Ambulatory Visit: Payer: Self-pay

## 2023-03-19 ENCOUNTER — Other Ambulatory Visit: Payer: Self-pay

## 2023-03-19 MED ORDER — FREESTYLE LIBRE 3 SENSOR MISC
1 refills | Status: DC
  Filled 2023-03-19: qty 6, 84d supply, fill #0

## 2023-03-21 ENCOUNTER — Other Ambulatory Visit: Payer: Self-pay

## 2023-03-22 ENCOUNTER — Other Ambulatory Visit: Payer: Self-pay

## 2023-03-22 MED ORDER — FREESTYLE LIBRE 3 PLUS SENSOR MISC
3 refills | Status: DC
  Filled 2023-03-22 – 2023-04-25 (×3): qty 6, 90d supply, fill #0
  Filled 2023-07-09 – 2023-07-24 (×2): qty 6, 90d supply, fill #1
  Filled 2023-10-08 – 2023-10-16 (×2): qty 6, 90d supply, fill #2

## 2023-03-22 MED ORDER — GLIMEPIRIDE 2 MG PO TABS
2.0000 mg | ORAL_TABLET | Freq: Every day | ORAL | 3 refills | Status: DC
  Filled 2023-03-22: qty 90, 90d supply, fill #0
  Filled 2023-06-20: qty 90, 90d supply, fill #1

## 2023-03-22 MED ORDER — ATORVASTATIN CALCIUM 20 MG PO TABS
20.0000 mg | ORAL_TABLET | Freq: Every day | ORAL | 3 refills | Status: AC
  Filled 2023-03-22: qty 90, 90d supply, fill #0
  Filled 2023-06-20: qty 90, 90d supply, fill #1
  Filled 2023-10-11: qty 90, 90d supply, fill #2
  Filled 2023-12-25: qty 90, 90d supply, fill #3

## 2023-03-25 ENCOUNTER — Other Ambulatory Visit: Payer: Self-pay

## 2023-04-19 ENCOUNTER — Other Ambulatory Visit: Payer: Self-pay

## 2023-04-19 DIAGNOSIS — M5412 Radiculopathy, cervical region: Secondary | ICD-10-CM | POA: Diagnosis not present

## 2023-04-19 MED ORDER — PREDNISONE 10 MG PO TABS
ORAL_TABLET | ORAL | 0 refills | Status: AC
  Filled 2023-04-19: qty 48, 12d supply, fill #0

## 2023-04-25 ENCOUNTER — Other Ambulatory Visit: Payer: Self-pay

## 2023-06-20 ENCOUNTER — Other Ambulatory Visit: Payer: Self-pay

## 2023-06-20 MED ORDER — TIRZEPATIDE 12.5 MG/0.5ML ~~LOC~~ SOAJ
12.5000 mg | SUBCUTANEOUS | 1 refills | Status: DC
  Filled 2023-06-20: qty 6, 84d supply, fill #0

## 2023-07-09 ENCOUNTER — Other Ambulatory Visit: Payer: Self-pay

## 2023-07-24 ENCOUNTER — Other Ambulatory Visit: Payer: Self-pay

## 2023-08-16 DIAGNOSIS — E119 Type 2 diabetes mellitus without complications: Secondary | ICD-10-CM | POA: Diagnosis not present

## 2023-08-20 ENCOUNTER — Telehealth: Admitting: Physician Assistant

## 2023-08-20 ENCOUNTER — Other Ambulatory Visit: Payer: Self-pay

## 2023-08-20 DIAGNOSIS — T63481A Toxic effect of venom of other arthropod, accidental (unintentional), initial encounter: Secondary | ICD-10-CM

## 2023-08-20 MED ORDER — HYDROXYZINE PAMOATE 25 MG PO CAPS
25.0000 mg | ORAL_CAPSULE | Freq: Three times a day (TID) | ORAL | 0 refills | Status: AC | PRN
  Filled 2023-08-20: qty 30, 10d supply, fill #0

## 2023-08-20 NOTE — Progress Notes (Signed)
 I have spent 5 minutes in review of e-visit questionnaire, review and updating patient chart, medical decision making and response to patient.   Elsie Velma Lunger, PA-C

## 2023-08-20 NOTE — Progress Notes (Signed)
 Message sent to patient requesting further input regarding current symptoms. Awaiting patient response.

## 2023-08-20 NOTE — Progress Notes (Signed)
 E-Visit for Insect Sting  Thank you for describing the insect sting for us .  Here is how we plan to help!  Based on what you have shared with me you have a couple of stings that we will treat with a short course of medication.  The 2 greatest risks from insect stings are allergic reaction, which can be fatal in some people and infection, which is more common and less serious.  Bees, wasps, yellow jackets, and hornets belong to a class of insects called Hymenoptera.  Most insect stings cause only minor discomfort.  Stings can happen anywhere on the body and can be painful.  Most stings are from honey bees or yellow jackets.  Fire ants can sting multiple times.  The sites of the stings are more likely to become infected.    Based on your information I have:, Provided a home care guide for insect stings and instructions on when to call for help., and I have sent in hydroxyzine  25 mg three times daily as needed over the next few days for itch and inflammation. This has been sent to the pharmacy you selected.  Please make sure that you selected a pharmacy that is open now.  What can be used to prevent Insect Stings?  Insect repellant with at least 20% DEET.  Wearing long pants and shirts with socks and shoes.  Wear dark or drab-colored clothes rather than bright colors.  Avoid using perfumes and hair sprays; these attract insects.  HOME CARE ADVICE:  1. Stinger removal: The stinger looks like a tiny black dot in the sting. Use a fingernail, credit card edge, or knife-edge to scrape it off.  Don't pull it out because it squeezes out more venom. If the stinger is below the skin surface, leave it alone.  It will be shed with normal skin healing. 2. Use cold compresses to the area of the sting for 10-20 minutes.  You may repeat this as needed to relieve symptoms of pain and swelling. 3.  For pain relief, take acetominophen 650 mg 4-6 hours as needed or ibuprofen  400 mg every 6-8 hours as needed  or naproxen 250-500 mg every 12 hours as needed. 4.  You can also use hydrocortisone cream 0.5% or 1% up to 4 times daily as needed for itching. 5.  If the sting becomes very itchy, take Benadryl 25-50 mg, follow directions on box. 6.  Wash the area 2-3 times daily with antibacterial soap and warm water . 7. Call your Doctor if: Fever, a severe headache, or rash occur in the next 2 weeks. Sting area begins to look infected. Redness and swelling worsens after home treatment. Your current symptoms become worse.    MAKE SURE YOU:  Understand these instructions. Will watch your condition. Will get help right away if you are not doing well or get worse.  Thank you for choosing an e-visit.  Your e-visit answers were reviewed by a board certified advanced clinical practitioner to complete your personal care plan. Depending upon the condition, your plan could have included both over the counter or prescription medications.  Please review your pharmacy choice. Make sure the pharmacy is open so you can pick up prescription now. If there is a problem, you may contact your provider through [employer] and have the prescription routed to another pharmacy.  Your safety is important to us . If you have drug allergies check your prescription carefully.   For the next 24 hours you can use MyChart to ask questions  about today's visit, request a non-urgent call back, or ask for a work or school excuse. You will get an email in the next two days asking about your experience. I hope that your e-visit has been valuable and will speed your recovery.

## 2023-08-27 DIAGNOSIS — E119 Type 2 diabetes mellitus without complications: Secondary | ICD-10-CM | POA: Diagnosis not present

## 2023-09-05 ENCOUNTER — Other Ambulatory Visit: Payer: Self-pay

## 2023-09-05 DIAGNOSIS — E119 Type 2 diabetes mellitus without complications: Secondary | ICD-10-CM | POA: Diagnosis not present

## 2023-09-05 MED ORDER — LISINOPRIL 10 MG PO TABS
5.0000 mg | ORAL_TABLET | Freq: Every day | ORAL | 3 refills | Status: AC
  Filled 2023-09-05: qty 45, 90d supply, fill #0
  Filled 2023-10-08 – 2023-12-25 (×3): qty 45, 90d supply, fill #1

## 2023-09-05 MED ORDER — MOUNJARO 15 MG/0.5ML ~~LOC~~ SOAJ
15.0000 mg | SUBCUTANEOUS | 1 refills | Status: DC
  Filled 2023-09-05: qty 2, 28d supply, fill #0
  Filled 2023-10-02: qty 2, 28d supply, fill #1
  Filled 2023-10-02: qty 6, 84d supply, fill #1
  Filled 2023-12-12: qty 4, 56d supply, fill #2

## 2023-09-06 ENCOUNTER — Other Ambulatory Visit: Payer: Self-pay

## 2023-10-02 ENCOUNTER — Other Ambulatory Visit: Payer: Self-pay

## 2023-10-08 ENCOUNTER — Other Ambulatory Visit: Payer: Self-pay

## 2023-10-08 MED ORDER — METFORMIN HCL ER 500 MG PO TB24
1000.0000 mg | ORAL_TABLET | Freq: Two times a day (BID) | ORAL | 3 refills | Status: AC
  Filled 2023-10-08: qty 360, 90d supply, fill #0
  Filled 2023-12-25: qty 360, 90d supply, fill #1

## 2023-10-08 MED ORDER — DAPAGLIFLOZIN PROPANEDIOL 10 MG PO TABS
10.0000 mg | ORAL_TABLET | Freq: Every morning | ORAL | 3 refills | Status: AC
  Filled 2023-10-08: qty 90, 90d supply, fill #0
  Filled 2023-12-25: qty 90, 90d supply, fill #1

## 2023-10-11 ENCOUNTER — Other Ambulatory Visit: Payer: Self-pay

## 2023-10-16 ENCOUNTER — Other Ambulatory Visit: Payer: Self-pay

## 2023-10-25 ENCOUNTER — Other Ambulatory Visit: Payer: Self-pay

## 2023-11-07 ENCOUNTER — Other Ambulatory Visit: Payer: Self-pay

## 2023-11-07 DIAGNOSIS — R5383 Other fatigue: Secondary | ICD-10-CM | POA: Diagnosis not present

## 2023-11-07 DIAGNOSIS — E785 Hyperlipidemia, unspecified: Secondary | ICD-10-CM | POA: Diagnosis not present

## 2023-11-07 DIAGNOSIS — R5381 Other malaise: Secondary | ICD-10-CM | POA: Diagnosis not present

## 2023-11-07 DIAGNOSIS — E119 Type 2 diabetes mellitus without complications: Secondary | ICD-10-CM | POA: Diagnosis not present

## 2023-11-07 DIAGNOSIS — Z1331 Encounter for screening for depression: Secondary | ICD-10-CM | POA: Diagnosis not present

## 2023-11-07 DIAGNOSIS — Z0000 Encounter for general adult medical examination without abnormal findings: Secondary | ICD-10-CM | POA: Diagnosis not present

## 2023-11-07 DIAGNOSIS — I10 Essential (primary) hypertension: Secondary | ICD-10-CM | POA: Diagnosis not present

## 2023-11-07 MED ORDER — MELOXICAM 7.5 MG PO TABS
7.5000 mg | ORAL_TABLET | Freq: Every day | ORAL | 1 refills | Status: AC | PRN
  Filled 2023-11-07: qty 30, 30d supply, fill #0

## 2023-11-07 MED ORDER — CYCLOBENZAPRINE HCL 5 MG PO TABS
5.0000 mg | ORAL_TABLET | Freq: Every evening | ORAL | 1 refills | Status: AC | PRN
Start: 2023-11-07 — End: ?
  Filled 2023-11-07: qty 30, 30d supply, fill #0

## 2023-11-08 ENCOUNTER — Other Ambulatory Visit: Payer: Self-pay

## 2023-11-08 MED ORDER — COMIRNATY 30 MCG/0.3ML IM SUSY
0.3000 mL | PREFILLED_SYRINGE | Freq: Once | INTRAMUSCULAR | 0 refills | Status: AC
  Filled 2023-11-08: qty 0.3, 1d supply, fill #0

## 2023-12-12 ENCOUNTER — Other Ambulatory Visit (HOSPITAL_COMMUNITY): Payer: Self-pay

## 2023-12-12 ENCOUNTER — Other Ambulatory Visit: Payer: Self-pay

## 2023-12-13 ENCOUNTER — Other Ambulatory Visit: Payer: Self-pay

## 2023-12-13 MED ORDER — MOUNJARO 15 MG/0.5ML ~~LOC~~ SOAJ
15.0000 mg | SUBCUTANEOUS | 0 refills | Status: DC
  Filled 2023-12-13: qty 6, 84d supply, fill #0

## 2023-12-25 ENCOUNTER — Other Ambulatory Visit: Payer: Self-pay

## 2023-12-31 ENCOUNTER — Other Ambulatory Visit: Payer: Self-pay

## 2023-12-31 MED ORDER — FREESTYLE LIBRE 3 PLUS SENSOR MISC
3 refills | Status: AC
  Filled 2023-12-31 – 2024-01-03 (×2): qty 6, 90d supply, fill #0

## 2024-01-03 ENCOUNTER — Other Ambulatory Visit: Payer: Self-pay

## 2024-02-12 ENCOUNTER — Other Ambulatory Visit: Payer: Self-pay

## 2024-02-13 ENCOUNTER — Other Ambulatory Visit: Payer: Self-pay

## 2024-02-13 MED ORDER — MOUNJARO 15 MG/0.5ML ~~LOC~~ SOAJ
15.0000 mg | SUBCUTANEOUS | 0 refills | Status: AC
  Filled 2024-02-13: qty 6, 84d supply, fill #0

## 2024-02-14 ENCOUNTER — Other Ambulatory Visit: Payer: Self-pay
